# Patient Record
Sex: Female | Born: 1980 | Race: Black or African American | Hispanic: No | Marital: Single | Smoking: Never smoker
Health system: Southern US, Community
[De-identification: ages and names within clinical notes are randomized; demographics above are authoritative.]

## PROBLEM LIST (undated history)

## (undated) DIAGNOSIS — K819 Cholecystitis, unspecified: Secondary | ICD-10-CM

## (undated) DIAGNOSIS — E079 Disorder of thyroid, unspecified: Secondary | ICD-10-CM

## (undated) DIAGNOSIS — T7840XA Allergy, unspecified, initial encounter: Secondary | ICD-10-CM

## (undated) DIAGNOSIS — F32A Depression, unspecified: Secondary | ICD-10-CM

## (undated) DIAGNOSIS — E349 Endocrine disorder, unspecified: Secondary | ICD-10-CM

## (undated) DIAGNOSIS — N946 Dysmenorrhea, unspecified: Secondary | ICD-10-CM

## (undated) DIAGNOSIS — N939 Abnormal uterine and vaginal bleeding, unspecified: Secondary | ICD-10-CM

## (undated) DIAGNOSIS — D219 Benign neoplasm of connective and other soft tissue, unspecified: Secondary | ICD-10-CM

## (undated) DIAGNOSIS — F329 Major depressive disorder, single episode, unspecified: Secondary | ICD-10-CM

## (undated) DIAGNOSIS — E282 Polycystic ovarian syndrome: Secondary | ICD-10-CM

## (undated) DIAGNOSIS — G4730 Sleep apnea, unspecified: Secondary | ICD-10-CM

## (undated) DIAGNOSIS — Z889 Allergy status to unspecified drugs, medicaments and biological substances status: Secondary | ICD-10-CM

## (undated) DIAGNOSIS — E039 Hypothyroidism, unspecified: Secondary | ICD-10-CM

## (undated) DIAGNOSIS — K219 Gastro-esophageal reflux disease without esophagitis: Secondary | ICD-10-CM

## (undated) DIAGNOSIS — F419 Anxiety disorder, unspecified: Secondary | ICD-10-CM

## (undated) HISTORY — DX: Depression, unspecified: F32.A

## (undated) HISTORY — DX: Abnormal uterine and vaginal bleeding, unspecified: N93.9

## (undated) HISTORY — DX: Anxiety disorder, unspecified: F41.9

## (undated) HISTORY — PX: CHOLECYSTECTOMY: SHX55

## (undated) HISTORY — DX: Dysmenorrhea, unspecified: N94.6

## (undated) HISTORY — DX: Allergy, unspecified, initial encounter: T78.40XA

## (undated) HISTORY — DX: Hypothyroidism, unspecified: E03.9

## (undated) HISTORY — DX: Major depressive disorder, single episode, unspecified: F32.9

## (undated) HISTORY — DX: Endocrine disorder, unspecified: E34.9

## (undated) HISTORY — DX: Benign neoplasm of connective and other soft tissue, unspecified: D21.9

## (undated) HISTORY — DX: Polycystic ovarian syndrome: E28.2

---

## 2014-03-24 ENCOUNTER — Emergency Department: Payer: Self-pay | Admitting: Emergency Medicine

## 2014-03-24 LAB — BASIC METABOLIC PANEL
Anion Gap: 7 (ref 7–16)
BUN: 10 mg/dL (ref 7–18)
CHLORIDE: 110 mmol/L — AB (ref 98–107)
CO2: 24 mmol/L (ref 21–32)
CREATININE: 0.88 mg/dL (ref 0.60–1.30)
Calcium, Total: 7.9 mg/dL — ABNORMAL LOW (ref 8.5–10.1)
EGFR (African American): >60
EGFR (Non-African Amer.): >60
GLUCOSE: 71 mg/dL (ref 65–99)
Osmolality: 279 (ref 275–301)
POTASSIUM: 3.9 mmol/L (ref 3.5–5.1)
Sodium: 141 mmol/L (ref 136–145)

## 2014-03-24 LAB — CBC
HCT: 33.8 % — ABNORMAL LOW (ref 35.0–47.0)
HGB: 10.6 g/dL — ABNORMAL LOW (ref 12.0–16.0)
MCH: 23.5 pg — AB (ref 26.0–34.0)
MCHC: 31.4 g/dL — ABNORMAL LOW (ref 32.0–36.0)
MCV: 75 fL — ABNORMAL LOW (ref 80–100)
Platelet: 224 10*3/uL (ref 150–440)
RBC: 4.52 10*6/uL (ref 3.80–5.20)
RDW: 19.9 % — AB (ref 11.5–14.5)
WBC: 7 10*3/uL (ref 3.6–11.0)

## 2014-03-24 LAB — TROPONIN I: Troponin-I: <0.02 ng/mL

## 2014-03-24 LAB — D-DIMER(ARMC): D-DIMER: 850 ng/mL

## 2014-11-27 ENCOUNTER — Emergency Department (HOSPITAL_COMMUNITY)
Admission: EM | Admit: 2014-11-27 | Discharge: 2014-11-27 | Disposition: A | Discharge status: Home / Self Care | Payer: Medicaid Other | Attending: Emergency Medicine | Admitting: Emergency Medicine

## 2014-11-27 DIAGNOSIS — Z3202 Encounter for pregnancy test, result negative: Secondary | ICD-10-CM | POA: Insufficient documentation

## 2014-11-27 DIAGNOSIS — Z880 Allergy status to penicillin: Secondary | ICD-10-CM | POA: Insufficient documentation

## 2014-11-27 DIAGNOSIS — R55 Syncope and collapse: Secondary | ICD-10-CM | POA: Diagnosis not present

## 2014-11-27 LAB — CBC
HCT: 39.6 % (ref 36.0–46.0)
Hemoglobin: 12.7 g/dL (ref 12.0–15.0)
MCH: 25 pg — ABNORMAL LOW (ref 26.0–34.0)
MCHC: 32.1 g/dL (ref 30.0–36.0)
MCV: 78 fL (ref 78.0–100.0)
Platelets: 224 10*3/uL (ref 150–400)
RBC: 5.08 MIL/uL (ref 3.87–5.11)
RDW: 17.4 % — ABNORMAL HIGH (ref 11.5–15.5)
WBC: 7.1 10*3/uL (ref 4.0–10.5)

## 2014-11-27 LAB — BASIC METABOLIC PANEL
Anion gap: 4 — ABNORMAL LOW (ref 5–15)
BUN: 11 mg/dL (ref 6–20)
CHLORIDE: 106 mmol/L (ref 101–111)
CO2: 24 mmol/L (ref 22–32)
CREATININE: 0.6 mg/dL (ref 0.44–1.00)
Calcium: 8.6 mg/dL — ABNORMAL LOW (ref 8.9–10.3)
GFR calc Af Amer: >60 mL/min (ref >60)
GFR calc non Af Amer: >60 mL/min (ref >60)
Glucose, Bld: 77 mg/dL (ref 65–99)
Potassium: 3.5 mmol/L (ref 3.5–5.1)
SODIUM: 134 mmol/L — AB (ref 135–145)

## 2014-11-27 LAB — I-STAT CHEM 8, ED
BUN: 10 mg/dL (ref 6–20)
CREATININE: 0.7 mg/dL (ref 0.44–1.00)
Calcium, Ion: 1.24 mmol/L — ABNORMAL HIGH (ref 1.12–1.23)
Chloride: 101 mmol/L (ref 101–111)
GLUCOSE: 75 mg/dL (ref 65–99)
HCT: 43 % (ref 36.0–46.0)
Hemoglobin: 14.6 g/dL (ref 12.0–15.0)
Potassium: 3.5 mmol/L (ref 3.5–5.1)
Sodium: 140 mmol/L (ref 135–145)
TCO2: 22 mmol/L (ref 0–100)

## 2014-11-27 LAB — CBG MONITORING, ED
Glucose-Capillary: 64 mg/dL — ABNORMAL LOW (ref 65–99)
Glucose-Capillary: 71 mg/dL (ref 65–99)
Glucose-Capillary: 102 mg/dL — ABNORMAL HIGH (ref 65–99)

## 2014-11-27 LAB — POC URINE PREG, ED: Preg Test, Ur: NEGATIVE

## 2014-11-27 MED ORDER — SODIUM CHLORIDE 0.9 % IV BOLUS (SEPSIS)
1000.0000 mL | Freq: Once | INTRAVENOUS | Status: AC
  Administered 2014-11-27: 1000 mL via INTRAVENOUS

## 2014-11-27 NOTE — ED Notes (Signed)
Per ems pt had near syncope at a grocery store , per ems pt felt near syncope with blurry vision and headache. Pt also co vaginal bleeding since March.  Pt is alert and oriented x4, with one complaint at this time, headache.

## 2014-11-27 NOTE — ED Notes (Signed)
Sandwich and juice offered to pt

## 2014-11-27 NOTE — Discharge Instructions (Signed)
Return to the ED with any concerns including chest pain, fanting, vomiting and not able to keep down liquids, decreased level of alertness/lethargy, or any other alarming symptoms

## 2014-11-27 NOTE — ED Provider Notes (Signed)
CSN: [id number]     Arrival date & time 11/27/14  1416 History   First MD Initiated Contact with Patient 11/27/14 1504     Chief Complaint  Patient presents with  . Near Syncope     (Consider location/radiation/quality/duration/timing/severity/associated sxs/prior Treatment) HPI  Pt presenting with c/o near syncope. Pt was at a grocery store- she felt light headed with tunnel vision.  No chest pain or palpitations.  She has mild headache, frontal associated.  No difficulty breathing.  No syncope.  Felt nauseated this morning, no vomiting.  No diarrhea.  There are no other associated systemic symptoms, there are no other alleviating or modifying factors.  No focal weakness or numbness, no changes in vision or speech.  Pt does note that she has been having vaginal spotting over the past 27months- this is after having implanon started- she talked with doctors in [place] who told her this could be normal side effect of the implanon.  She has no abdominal pain.  There are no other associated systemic symptoms, there are no other alleviating or modifying factors.   No past medical history on file. No past surgical history on file. No family history on file. History  Substance Use Topics  . Smoking status: Not on file  . Smokeless tobacco: Not on file  . Alcohol Use: Not on file   OB History    No data available     Review of Systems  ROS reviewed and all otherwise negative except for mentioned in HPI    Allergies  Aspirin and Penicillins  Home Medications   Prior to Admission medications   Medication Sig Start Date End Date Taking? Authorizing Provider  etonogestrel (NEXPLANON) 68 MG IMPL implant 1 each by Subdermal route once.   Yes Historical Provider, MD   BP 95/66 mmHg  Pulse 82  Temp(Src) 98.3 F (36.8 C) (Oral)  Resp 16  SpO2 98%  Vitals reviewed Physical Exam  Physical Examination: General appearance - alert, well appearing, and in no distress Mental status - alert,  oriented to person, place, and time Eyes - no conjunctival injection no scleral icterus Mouth - mucous membranes moist, pharynx normal without lesions Chest - clear to auscultation, no wheezes, rales or rhonchi, symmetric air entry Heart - normal rate, regular rhythm, normal S1, S2, no murmurs, rubs, clicks or gallops Abdomen - soft, nontender, nondistended, no masses or organomegaly Extremities - peripheral pulses normal, no pedal edema, no clubbing or cyanosis Skin - normal coloration and turgor, no rashes  ED Course  Procedures (including critical care time) Labs Review Labs Reviewed  CBC - Abnormal; Notable for the following:    MCH 25.0 (*)    RDW 17.4 (*)    All other components within normal limits  BASIC METABOLIC PANEL - Abnormal; Notable for the following:    Sodium 134 (*)    Calcium 8.6 (*)    Anion gap 4 (*)    All other components within normal limits  I-STAT CHEM 8, ED - Abnormal; Notable for the following:    Calcium, Ion 1.24 (*)    All other components within normal limits  CBG MONITORING, ED - Abnormal; Notable for the following:    Glucose-Capillary 64 (*)    All other components within normal limits  CBG MONITORING, ED - Abnormal; Notable for the following:    Glucose-Capillary 102 (*)    All other components within normal limits  POC URINE PREG, ED  CBG MONITORING, ED  Imaging Review No results found.   EKG Interpretation   Date/Time:  Thursday Nov 27 2014 14:29:18 EDT Ventricular Rate:  79 PR Interval:  156 QRS Duration: 83 QT Interval:  361 QTC Calculation: 414 R Axis:   73 Text Interpretation:  Sinus rhythm Baseline wander in lead(s) V1 No  significant change since last tracing Confirmed by Canary Brim  MD, MARTHA  [phone]) on 11/27/2014 5:32:36 PM      MDM   Final diagnoses:  Near syncope    Pt presenting after near syncope episode today.  Labs and EKG and vital signs are reassuring.  Pt does not meet [place] criteria for high risk  syncope.  She does note she has been having vaginal spottin over the past 2 months after implanon- she has followed up with doctors in [place] who implanted it and was told it is a normal side effect.  She is not anemic, not orthostatic.  No abdominal pain, negative pregnancy test.  Pt given followup information with gynecology.  Discharged with strict return precautions.  Pt agreeable with plan.    Alfonzo Beers, MD 11/27/14 2130

## 2014-11-27 NOTE — ED Notes (Signed)
Bed: SR15 Expected date:  Expected time:  Means of arrival:  Comments: EMS- 34yo F, near syncope, vaginal bleeding since March

## 2015-02-05 ENCOUNTER — Ambulatory Visit: Payer: Self-pay | Admitting: Family Medicine

## 2015-08-12 ENCOUNTER — Ambulatory Visit (HOSPITAL_BASED_OUTPATIENT_CLINIC_OR_DEPARTMENT_OTHER): Payer: Medicaid Other | Attending: Otolaryngology | Admitting: Radiology

## 2015-08-12 VITALS — Ht 66.0 in | Wt 270.0 lb

## 2015-08-12 DIAGNOSIS — R5383 Other fatigue: Secondary | ICD-10-CM | POA: Insufficient documentation

## 2015-08-12 DIAGNOSIS — G4733 Obstructive sleep apnea (adult) (pediatric): Secondary | ICD-10-CM | POA: Insufficient documentation

## 2015-08-12 DIAGNOSIS — Z6841 Body Mass Index (BMI) 40.0 and over, adult: Secondary | ICD-10-CM | POA: Diagnosis not present

## 2015-08-12 DIAGNOSIS — R0683 Snoring: Secondary | ICD-10-CM

## 2015-08-12 DIAGNOSIS — E669 Obesity, unspecified: Secondary | ICD-10-CM | POA: Insufficient documentation

## 2015-08-16 DIAGNOSIS — R0683 Snoring: Secondary | ICD-10-CM | POA: Diagnosis not present

## 2015-08-16 DIAGNOSIS — G4733 Obstructive sleep apnea (adult) (pediatric): Secondary | ICD-10-CM | POA: Diagnosis not present

## 2015-08-16 NOTE — Progress Notes (Signed)
Patient Name: Emily Houston, Emily Houston Date: 08/12/2015 Gender: Female D.O.B: February 06, 1981 Age (years): 34 Referring Provider: Jodi Marble Height (inches): 69 Interpreting Physician: Baird Lyons MD, ABSM Weight (lbs): 270 RPSGT: Laren Everts BMI: 44 MRN: [id number] Neck Size: 16.00 CLINICAL INFORMATION Sleep Study Type: Split Night CPAP Indication for sleep study: Fatigue, Obesity, Snoring Epworth Sleepiness Score: 9  SLEEP STUDY TECHNIQUE As per the AASM Manual for the Scoring of Sleep and Associated Events v2.3 (April 2016) with a hypopnea requiring 4% desaturations. The channels recorded and monitored were frontal, central and occipital EEG, electrooculogram (EOG), submentalis EMG (chin), nasal and oral airflow, thoracic and abdominal wall motion, anterior tibialis EMG, snore microphone, electrocardiogram, and pulse oximetry. Continuous positive airway pressure (CPAP) was initiated when the patient met split night criteria and was titrated according to treat sleep-disordered breathing.  MEDICATIONS Medications taken by the patient : charted for review Medications administered by patient during sleep study : No sleep medicine administered.  RESPIRATORY PARAMETERS Diagnostic Total AHI (/hr): 36.2 RDI (/hr): 37.5 OA Index (/hr): 19.9 CA Index (/hr): 0.0 REM AHI (/hr): 110.8 NREM AHI (/hr): 32.4 Supine AHI (/hr): 113.1 Non-supine AHI (/hr): 0.00 Min O2 Sat (%): 79.00 Mean O2 (%): 92.56 Time below 88% (min): 6.1   Titration Optimal Pressure (cm): 12 AHI at Optimal Pressure (/hr): 0.0 Min O2 at Optimal Pressure (%): 94.0 Supine % at Optimal (%): 54 Sleep % at Optimal (%): 97    SLEEP ARCHITECTURE The recording time for the entire night was 397.4 minutes. During a baseline period of 198.1 minutes, the patient slept for 136.0 minutes in REM and nonREM, yielding a sleep efficiency of 68.7%. Sleep onset after lights out was 54.7 minutes with a REM latency of 93.0 minutes. The  patient spent 12.13% of the night in stage N1 sleep, 49.26% in stage N2 sleep, 33.82% in stage N3 and 4.78% in REM. During the titration period of 191.3 minutes, the patient slept for 182.0 minutes in REM and nonREM, yielding a sleep efficiency of 95.1%. Sleep onset after CPAP initiation was 4.2 minutes with a REM latency of 31.5 minutes. The patient spent 8.52% of the night in stage N1 sleep, 56.59% in stage N2 sleep, 0.82% in stage N3 and 34.07% in REM.  CARDIAC DATA The 2 lead EKG demonstrated sinus rhythm. The mean heart rate was 85.19 beats per minute. Other EKG findings include: None.  LEG MOVEMENT DATA The total Periodic Limb Movements of Sleep (PLMS) were 0. The PLMS index was 0.00 .  IMPRESSIONS - Severe obstructive sleep apnea occurred during the diagnostic portion of the study (AHI = 36.2/hour). An optimal PAP pressure was selected for this patient ( 12 cm of water) - No significant central sleep apnea occurred during the diagnostic portion of the study (CAI = 0.0/hour). - Moderate oxygen desaturation was noted during the diagnostic portion of the study (Min O2 =79.00%). - The patient snored with Moderate snoring volume during the diagnostic portion of the study. - No cardiac abnormalities were noted during this study. - Clinically significant periodic limb movements did not occur during sleep.  DIAGNOSIS - Obstructive Sleep Apnea (327.23 [G47.33 ICD-10])  RECOMMENDATIONS - Trial of CPAP therapy on 12 cm H2O with a Medium size Resmed Full Face Mask AirFit F20 mask and heated humidification. - Avoid alcohol, sedatives and other CNS depressants that may worsen sleep apnea and disrupt normal sleep architecture. - Sleep hygiene should be reviewed to assess factors that may improve sleep quality. - Weight management and  regular exercise should be initiated or continued.  Deneise Lever Diplomate, American Board of Sleep Medicine  ELECTRONICALLY SIGNED ON:  08/16/2015, 10:43  AM [place] PH: [phone]   FX: [phone] [place]

## 2016-03-01 ENCOUNTER — Emergency Department (HOSPITAL_COMMUNITY): Payer: Medicaid Other

## 2016-03-01 ENCOUNTER — Encounter (HOSPITAL_COMMUNITY): Payer: Self-pay

## 2016-03-01 ENCOUNTER — Emergency Department (HOSPITAL_COMMUNITY)
Admission: EM | Admit: 2016-03-01 | Discharge: 2016-03-01 | Disposition: A | Discharge status: Home / Self Care | Payer: Medicaid Other | Attending: Emergency Medicine | Admitting: Emergency Medicine

## 2016-03-01 DIAGNOSIS — K8020 Calculus of gallbladder without cholecystitis without obstruction: Secondary | ICD-10-CM | POA: Diagnosis not present

## 2016-03-01 DIAGNOSIS — R112 Nausea with vomiting, unspecified: Secondary | ICD-10-CM | POA: Diagnosis present

## 2016-03-01 LAB — COMPREHENSIVE METABOLIC PANEL
ALT: 13 U/L — AB (ref 14–54)
AST: 18 U/L (ref 15–41)
Albumin: 3.3 g/dL — ABNORMAL LOW (ref 3.5–5.0)
Alkaline Phosphatase: 51 U/L (ref 38–126)
Anion gap: 4 — ABNORMAL LOW (ref 5–15)
BUN: 9 mg/dL (ref 6–20)
CHLORIDE: 108 mmol/L (ref 101–111)
CO2: 26 mmol/L (ref 22–32)
CREATININE: 0.77 mg/dL (ref 0.44–1.00)
Calcium: 8.4 mg/dL — ABNORMAL LOW (ref 8.9–10.3)
GFR calc Af Amer: >60 mL/min (ref >60)
Glucose, Bld: 85 mg/dL (ref 65–99)
POTASSIUM: 3.9 mmol/L (ref 3.5–5.1)
Sodium: 138 mmol/L (ref 135–145)
Total Bilirubin: 0.4 mg/dL (ref 0.3–1.2)
Total Protein: 6.5 g/dL (ref 6.5–8.1)

## 2016-03-01 LAB — CBC
HCT: 37.4 % (ref 36.0–46.0)
Hemoglobin: 12.4 g/dL (ref 12.0–15.0)
MCH: 26.8 pg (ref 26.0–34.0)
MCHC: 33.2 g/dL (ref 30.0–36.0)
MCV: 81 fL (ref 78.0–100.0)
PLATELETS: 208 10*3/uL (ref 150–400)
RBC: 4.62 MIL/uL (ref 3.87–5.11)
RDW: 15 % (ref 11.5–15.5)
WBC: 6.6 10*3/uL (ref 4.0–10.5)

## 2016-03-01 LAB — POC URINE PREG, ED: Preg Test, Ur: NEGATIVE

## 2016-03-01 LAB — URINALYSIS, ROUTINE W REFLEX MICROSCOPIC
Bilirubin Urine: NEGATIVE
Glucose, UA: NEGATIVE mg/dL
HGB URINE DIPSTICK: NEGATIVE
Ketones, ur: NEGATIVE mg/dL
LEUKOCYTES UA: NEGATIVE
Nitrite: NEGATIVE
PH: 7.5 (ref 5.0–8.0)
PROTEIN: NEGATIVE mg/dL
SPECIFIC GRAVITY, URINE: 1.024 (ref 1.005–1.030)

## 2016-03-01 LAB — LIPASE, BLOOD: LIPASE: 31 U/L (ref 11–51)

## 2016-03-01 MED ORDER — PANTOPRAZOLE SODIUM 20 MG PO TBEC: 20.0000 mg | DELAYED_RELEASE_TABLET | Freq: Every day | ORAL | 0 refills | Status: DC

## 2016-03-01 MED ORDER — ONDANSETRON HCL 4 MG PO TABS: 4.0000 mg | ORAL_TABLET | Freq: Four times a day (QID) | ORAL | 0 refills | Status: DC

## 2016-03-01 NOTE — ED Triage Notes (Signed)
Pt presents with c/o nausea and vomiting for a couple of weeks, no diarrhea at the present time. Pt denies being around anyone else who has been sick. NAD, ambulatory to room.

## 2016-03-01 NOTE — ED Notes (Signed)
Nurse stated he will collect the labs

## 2016-03-01 NOTE — ED Provider Notes (Signed)
[place] DEPT Provider Note   CSN: [id number] Arrival date & time: 03/01/16  1544     History   Chief Complaint Chief Complaint  Patient presents with  . Nausea  . Emesis    HPI Emily Houston is a 36 y.o. female.  HPI Emily Houston is a 35 y.o. female with no medical problems, presents to emergency department complaining of nausea and vomiting for several weeks. This is a recurrent problem for the patient. Patient states this time the episode has lasted the longest. She reports intermittent abdominal cramping. Denies any fever or chills. No diarrhea. No urinary symptoms. No vaginal discharge or bleeding. Does not think she is pregnant. Her last menstrual cycle was one month ago. She has not tried any medications to help with her nausea symptoms. She states that she has tried to adjust her diet, states that no matter what she eats she develops nausea and at times vomiting. Pt states she is scared to eat.   History reviewed. No pertinent past medical history.  There are no active problems to display for this patient.   Past Surgical History:  Procedure Laterality Date  . CESAREAN SECTION      OB History    No data available       Home Medications    Prior to Admission medications   Medication Sig Start Date End Date Taking? Authorizing Provider  fluticasone (FLONASE) 50 MCG/ACT nasal spray U 1 SPRAY IEN ONCE A DAY AS NEEDED FOR ALLERGIES 01/27/16  Yes Historical Provider, MD  MedroxyPROGESTERone Acetate 150 MG/ML SUSY INJECT 1 ML INTO THE MUSCLE Q 90 DAYS 01/25/16  Yes Historical Provider, MD  phentermine (ADIPEX-P) 37.5 MG tablet Take 37.5 mg by mouth daily before breakfast.   Yes Historical Provider, MD  baclofen (LIORESAL) 10 MG tablet TK 1 T PO ONE TO THREE TIMES DAILY PRN FOR PAIN 01/25/16   Historical Provider, MD    Family History No family history on file.  Social History Social History  Substance Use Topics  . Smoking status: Never Smoker    . Smokeless tobacco: Never Used  . Alcohol use No     Allergies   Aspirin and Penicillins   Review of Systems Review of Systems  Constitutional: Negative for chills and fever.  Respiratory: Negative for cough, chest tightness and shortness of breath.   Cardiovascular: Negative for chest pain, palpitations and leg swelling.  Gastrointestinal: Positive for abdominal pain, nausea and vomiting. Negative for diarrhea.  Genitourinary: Negative for dysuria, flank pain, pelvic pain, vaginal bleeding, vaginal discharge and vaginal pain.  Musculoskeletal: Negative for arthralgias, myalgias, neck pain and neck stiffness.  Skin: Negative for rash.  Neurological: Negative for dizziness, weakness and headaches.  All other systems reviewed and are negative.    Physical Exam Updated Vital Signs BP 116/70 (BP Location: Left Arm)   Pulse 70   Temp 98.2 F (36.8 C) (Oral)   Resp 18   Ht 5\' 6"  (1.676 m)   Wt 113.4 kg   LMP 01/31/2016 (Approximate)   SpO2 100%   BMI 40.35 kg/m   Physical Exam  Constitutional: She appears well-developed and well-nourished. No distress.  HENT:  Head: Normocephalic.  Eyes: Conjunctivae are normal.  Neck: Neck supple.  Cardiovascular: Normal rate, regular rhythm and normal heart sounds.   Pulmonary/Chest: Effort normal and breath sounds normal. No respiratory distress. She has no wheezes. She has no rales.  Abdominal: Soft. Bowel sounds are normal. She exhibits no distension. There  is no tenderness. There is no rebound and no guarding.  Musculoskeletal: She exhibits no edema.  Neurological: She is alert.  Skin: Skin is warm and dry.  Psychiatric: She has a normal mood and affect. Her behavior is normal.  Nursing note and vitals reviewed.    ED Treatments / Results  Labs (all labs ordered are listed, but only abnormal results are displayed) Labs Reviewed  COMPREHENSIVE METABOLIC PANEL - Abnormal; Notable for the following:       Result Value    Calcium 8.4 (*)    Albumin 3.3 (*)    ALT 13 (*)    Anion gap 4 (*)    All other components within normal limits  LIPASE, BLOOD  CBC  URINALYSIS, ROUTINE W REFLEX MICROSCOPIC (NOT AT [facility])  POC URINE PREG, ED    EKG  EKG Interpretation None       Radiology US Abdomen Complete  Result Date: 03/01/2016 CLINICAL DATA:  Postprandial nausea and vomiting.  Abdominal pain. EXAM: ABDOMEN ULTRASOUND COMPLETE COMPARISON:  None. FINDINGS: Gallbladder: Chronic gallstones are present. The largest measures 1.0 cm. There is shadowing. Maximal wall thickening is 2.4 mm, within normal limits. The gallbladder seems somewhat contracted. There is no sonographic Murphy sign reported. Common bile duct: Diameter: 2.8 mm, within normal limits. Liver: No focal lesion identified. Within normal limits in parenchymal echogenicity. IVC: No abnormality visualized. Pancreas: Visualized portion unremarkable. Spleen: Size and appearance within normal limits. Right Kidney: Length: 11.4 cm, within normal limits. Echogenicity within normal limits. No mass or hydronephrosis visualized. Left Kidney: Length: 11.7 cm, within normal limits. Echogenicity within normal limits. No mass or hydronephrosis visualized. Abdominal aorta: No aneurysm visualized. Other findings: None. IMPRESSION: 1. Cholelithiasis without evidence for cholecystitis. 2. No acute or focal abnormality to explain the patient's symptoms otherwise. Electronically Signed   By: San Morelle M.D.   On: 03/01/2016 17:34    Procedures Procedures (including critical care time)  Medications Ordered in ED Medications - No data to display   Initial Impression / Assessment and Plan / ED Course  I have reviewed the triage vital signs and the nursing notes.  Pertinent labs & imaging results that were available during my care of the patient were reviewed by me and considered in my medical decision making (see chart for details).  Clinical Course  Comment By  Time  Pt seen and examined. Pt with persistent nausea and vomiting, intermittent abdominal pain. It is in NAD. Will check labs, preg test, Fleeta Emmer, PA-C 08/15 1656   Patient emergency department with nausea and vomiting, intermittent abdominal pain, worse after eating. Lab work including LFTs, lipase, CBC obtained, all negative. Calcium slightly low at 8.4. Albumin slightly low at 3.3. Urine pregnancy is negative. Urinalysis is unremarkable. Ultrasound abdomen obtained which shows cholelithiasis, otherwise normal.  Patient is currently symptom free. She is tolerating oral liquids and foods. Abdomen is currently nontender. I do not think she needs any further emergency room workup. Will have a follow-up with general surgery. On Protonix and Zofran.   Final Clinical Impressions(s) / ED Diagnoses   Final diagnoses:  Calculus of gallbladder without cholecystitis without obstruction    New Prescriptions New Prescriptions   No medications on file     Jeannett Senior, PA-C 03/01/16 [place], MD 03/01/16 [phone]

## 2016-03-01 NOTE — ED Notes (Signed)
Bed: WTR6 Expected date:  Expected time:  Means of arrival:  Comments: 

## 2016-03-01 NOTE — Discharge Instructions (Signed)
Take protonix for abdominal pain. Take zofran for nausea. Avoid any fatty of spicy foods. Follow up with general surgery for further evaluation of gall bladder. Return if worsening.

## 2016-03-01 NOTE — ED Notes (Signed)
US at bedside

## 2016-03-11 ENCOUNTER — Ambulatory Visit: Payer: Self-pay | Admitting: Surgery

## 2016-03-14 ENCOUNTER — Encounter (HOSPITAL_COMMUNITY): Payer: Self-pay | Admitting: *Deleted

## 2016-03-14 MED ORDER — VANCOMYCIN HCL 10 G IV SOLR
1500.0000 mg | INTRAVENOUS | Status: AC
  Administered 2016-03-15: 1500 mg via INTRAVENOUS
  Filled 2016-03-14 (×2): qty 1500

## 2016-03-14 MED ORDER — HEPARIN SODIUM (PORCINE) 5000 UNIT/ML IJ SOLN
5000.0000 [IU] | Freq: Once | INTRAMUSCULAR | Status: AC
  Administered 2016-03-15: 5000 [IU] via SUBCUTANEOUS
  Filled 2016-03-14: qty 1

## 2016-03-14 NOTE — Progress Notes (Signed)
Pt denies SOB, chest pain, and being under the care of a cardiologist. Pt denies having a stress test, echo and cardiac cath. Pt denies having an EKG and chest x ray within the last year. Pt had labs on 03/01/16 ( in [place]). Pt stated that her last dose of Phentermine was " a Couple of weeks ago."  Pt made aware to stop taking Aspirin, vitamins, Phentermine, Fish oil and herbal medications. Do not take any NSAIDs ie: Ibuprofen, Advil, Naproxen, BC and Goody Powder or any medication containing Aspirin. Pt verbalized understanding of all pre-op instructions.

## 2016-03-15 ENCOUNTER — Ambulatory Visit (HOSPITAL_COMMUNITY): Payer: Medicaid Other | Admitting: Certified Registered"

## 2016-03-15 ENCOUNTER — Encounter (HOSPITAL_COMMUNITY)
Admission: RE | Disposition: A | Discharge status: Home / Self Care | Payer: Self-pay | Source: Ambulatory Visit | Attending: Surgery

## 2016-03-15 ENCOUNTER — Encounter (HOSPITAL_COMMUNITY): Payer: Self-pay | Admitting: *Deleted

## 2016-03-15 ENCOUNTER — Ambulatory Visit (HOSPITAL_COMMUNITY)
Admission: RE | Admit: 2016-03-15 | Discharge: 2016-03-15 | Disposition: A | Discharge status: Home / Self Care | Payer: Medicaid Other | Source: Ambulatory Visit | Attending: Surgery | Admitting: Surgery

## 2016-03-15 DIAGNOSIS — K8010 Calculus of gallbladder with chronic cholecystitis without obstruction: Secondary | ICD-10-CM | POA: Diagnosis not present

## 2016-03-15 DIAGNOSIS — Z6841 Body Mass Index (BMI) 40.0 and over, adult: Secondary | ICD-10-CM | POA: Diagnosis not present

## 2016-03-15 DIAGNOSIS — E669 Obesity, unspecified: Secondary | ICD-10-CM | POA: Insufficient documentation

## 2016-03-15 DIAGNOSIS — Z79899 Other long term (current) drug therapy: Secondary | ICD-10-CM | POA: Insufficient documentation

## 2016-03-15 DIAGNOSIS — K219 Gastro-esophageal reflux disease without esophagitis: Secondary | ICD-10-CM | POA: Diagnosis not present

## 2016-03-15 DIAGNOSIS — K811 Chronic cholecystitis: Secondary | ICD-10-CM | POA: Diagnosis present

## 2016-03-15 DIAGNOSIS — Z793 Long term (current) use of hormonal contraceptives: Secondary | ICD-10-CM | POA: Insufficient documentation

## 2016-03-15 HISTORY — DX: Gastro-esophageal reflux disease without esophagitis: K21.9

## 2016-03-15 HISTORY — PX: CHOLECYSTECTOMY: SHX55

## 2016-03-15 HISTORY — DX: Cholecystitis, unspecified: K81.9

## 2016-03-15 HISTORY — DX: Morbid (severe) obesity due to excess calories: E66.01

## 2016-03-15 HISTORY — DX: Allergy status to unspecified drugs, medicaments and biological substances: Z88.9

## 2016-03-15 HISTORY — DX: Sleep apnea, unspecified: G47.30

## 2016-03-15 SURGERY — LAPAROSCOPIC CHOLECYSTECTOMY WITH INTRAOPERATIVE CHOLANGIOGRAM
Anesthesia: General

## 2016-03-15 SURGERY — LAPAROSCOPIC CHOLECYSTECTOMY
Anesthesia: General | Site: Abdomen

## 2016-03-15 MED ORDER — ROCURONIUM BROMIDE 10 MG/ML (PF) SYRINGE
PREFILLED_SYRINGE | INTRAVENOUS | Status: AC
  Filled 2016-03-15: qty 10

## 2016-03-15 MED ORDER — FENTANYL CITRATE (PF) 100 MCG/2ML IJ SOLN
INTRAMUSCULAR | Status: AC
  Filled 2016-03-15: qty 2

## 2016-03-15 MED ORDER — HYDROCODONE-ACETAMINOPHEN 5-325 MG PO TABS
ORAL_TABLET | ORAL | Status: AC
  Filled 2016-03-15: qty 1

## 2016-03-15 MED ORDER — DOCUSATE SODIUM 100 MG PO CAPS: 100.0000 mg | ORAL_CAPSULE | Freq: Two times a day (BID) | ORAL | 0 refills | Status: AC

## 2016-03-15 MED ORDER — LIDOCAINE HCL (CARDIAC) 20 MG/ML IV SOLN
INTRAVENOUS | Status: DC | PRN
  Administered 2016-03-15: 90 mg via INTRAVENOUS

## 2016-03-15 MED ORDER — SUGAMMADEX SODIUM 500 MG/5ML IV SOLN
INTRAVENOUS | Status: DC | PRN
  Administered 2016-03-15: 300 mg via INTRAVENOUS

## 2016-03-15 MED ORDER — ROCURONIUM BROMIDE 100 MG/10ML IV SOLN
INTRAVENOUS | Status: DC | PRN
  Administered 2016-03-15: 50 mg via INTRAVENOUS
  Administered 2016-03-15: 10 mg via INTRAVENOUS

## 2016-03-15 MED ORDER — MIDAZOLAM HCL 5 MG/5ML IJ SOLN
INTRAMUSCULAR | Status: DC | PRN
  Administered 2016-03-15: 2 mg via INTRAVENOUS

## 2016-03-15 MED ORDER — LIDOCAINE 2% (20 MG/ML) 5 ML SYRINGE
INTRAMUSCULAR | Status: AC
  Filled 2016-03-15: qty 5

## 2016-03-15 MED ORDER — PROPOFOL 10 MG/ML IV BOLUS
INTRAVENOUS | Status: AC
  Filled 2016-03-15: qty 20

## 2016-03-15 MED ORDER — ONDANSETRON HCL 4 MG/2ML IJ SOLN
INTRAMUSCULAR | Status: DC | PRN
  Administered 2016-03-15: 4 mg via INTRAVENOUS

## 2016-03-15 MED ORDER — PROPOFOL 10 MG/ML IV BOLUS
INTRAVENOUS | Status: DC | PRN
  Administered 2016-03-15: 170 mg via INTRAVENOUS

## 2016-03-15 MED ORDER — MEPERIDINE HCL 25 MG/ML IJ SOLN: 6.2500 mg | INTRAMUSCULAR | Status: DC | PRN

## 2016-03-15 MED ORDER — LACTATED RINGERS IV SOLN
INTRAVENOUS | Status: DC
  Administered 2016-03-15: 11:00:00 via INTRAVENOUS

## 2016-03-15 MED ORDER — EPHEDRINE 5 MG/ML INJ
INTRAVENOUS | Status: AC
  Filled 2016-03-15: qty 10

## 2016-03-15 MED ORDER — PHENYLEPHRINE 40 MCG/ML (10ML) SYRINGE FOR IV PUSH (FOR BLOOD PRESSURE SUPPORT)
PREFILLED_SYRINGE | INTRAVENOUS | Status: AC
  Filled 2016-03-15: qty 10

## 2016-03-15 MED ORDER — HYDROCODONE-ACETAMINOPHEN 5-325 MG PO TABS
1.0000 | ORAL_TABLET | Freq: Once | ORAL | Status: AC
  Administered 2016-03-15: 1 via ORAL

## 2016-03-15 MED ORDER — BUPIVACAINE-EPINEPHRINE 0.25% -1:200000 IJ SOLN
INTRAMUSCULAR | Status: DC | PRN
  Administered 2016-03-15: 30 mL

## 2016-03-15 MED ORDER — PHENYLEPHRINE HCL 10 MG/ML IJ SOLN
INTRAMUSCULAR | Status: DC | PRN
  Administered 2016-03-15 (×2): 120 ug via INTRAVENOUS

## 2016-03-15 MED ORDER — HYDROCODONE-ACETAMINOPHEN 5-325 MG PO TABS: 1.0000 | ORAL_TABLET | Freq: Four times a day (QID) | ORAL | 0 refills | Status: DC | PRN

## 2016-03-15 MED ORDER — SODIUM CHLORIDE 0.9 % IR SOLN
Status: DC | PRN
  Administered 2016-03-15: 1000 mL

## 2016-03-15 MED ORDER — SODIUM CHLORIDE 0.9 % IV SOLN: 250.0000 mL | INTRAVENOUS | Status: DC | PRN

## 2016-03-15 MED ORDER — ONDANSETRON HCL 4 MG/2ML IJ SOLN: 4.0000 mg | Freq: Once | INTRAMUSCULAR | Status: DC | PRN

## 2016-03-15 MED ORDER — HYDROMORPHONE HCL 1 MG/ML IJ SOLN
0.2500 mg | INTRAMUSCULAR | Status: DC | PRN
Start: 2016-03-15 — End: 2016-03-15
  Administered 2016-03-15 (×2): 0.5 mg via INTRAVENOUS

## 2016-03-15 MED ORDER — CHLORHEXIDINE GLUCONATE CLOTH 2 % EX PADS: 6.0000 | MEDICATED_PAD | Freq: Once | CUTANEOUS | Status: DC

## 2016-03-15 MED ORDER — HYDROMORPHONE HCL 1 MG/ML IJ SOLN
INTRAMUSCULAR | Status: AC
  Filled 2016-03-15: qty 1

## 2016-03-15 MED ORDER — EPHEDRINE SULFATE 50 MG/ML IJ SOLN
INTRAMUSCULAR | Status: DC | PRN
  Administered 2016-03-15: 10 mg via INTRAVENOUS

## 2016-03-15 MED ORDER — 0.9 % SODIUM CHLORIDE (POUR BTL) OPTIME
TOPICAL | Status: DC | PRN
  Administered 2016-03-15: 1000 mL

## 2016-03-15 MED ORDER — FENTANYL CITRATE (PF) 100 MCG/2ML IJ SOLN
INTRAMUSCULAR | Status: DC | PRN
  Administered 2016-03-15 (×2): 50 ug via INTRAVENOUS
  Administered 2016-03-15: 100 ug via INTRAVENOUS

## 2016-03-15 MED ORDER — MIDAZOLAM HCL 2 MG/2ML IJ SOLN
INTRAMUSCULAR | Status: AC
  Filled 2016-03-15: qty 2

## 2016-03-15 MED ORDER — BUPIVACAINE-EPINEPHRINE (PF) 0.25% -1:200000 IJ SOLN
INTRAMUSCULAR | Status: AC
  Filled 2016-03-15: qty 30

## 2016-03-15 MED ORDER — SODIUM CHLORIDE 0.9% FLUSH: 3.0000 mL | Freq: Two times a day (BID) | INTRAVENOUS | Status: DC

## 2016-03-15 MED ORDER — SODIUM CHLORIDE 0.9% FLUSH: 3.0000 mL | INTRAVENOUS | Status: DC | PRN

## 2016-03-15 SURGICAL SUPPLY — 45 items
APPLIER CLIP 5 13 M/L LIGAMAX5 (MISCELLANEOUS) ×3
APPLIER CLIP ROT 10 11.4 M/L (STAPLE)
BENZOIN TINCTURE PRP APPL 2/3 (GAUZE/BANDAGES/DRESSINGS) ×3 IMPLANT
BLADE SURG ROTATE 9660 (MISCELLANEOUS) IMPLANT
CANISTER SUCTION 2500CC (MISCELLANEOUS) ×3 IMPLANT
CHLORAPREP W/TINT 26ML (MISCELLANEOUS) ×3 IMPLANT
CLIP APPLIE 5 13 M/L LIGAMAX5 (MISCELLANEOUS) ×1 IMPLANT
CLIP APPLIE ROT 10 11.4 M/L (STAPLE) IMPLANT
CLOSURE WOUND 1/2 X4 (GAUZE/BANDAGES/DRESSINGS)
COVER SURGICAL LIGHT HANDLE (MISCELLANEOUS) ×3 IMPLANT
DRSG TEGADERM 2-3/8X2-3/4 SM (GAUZE/BANDAGES/DRESSINGS) IMPLANT
DRSG TEGADERM 4X4.75 (GAUZE/BANDAGES/DRESSINGS) IMPLANT
ELECT REM PT RETURN 9FT ADLT (ELECTROSURGICAL) ×3
ELECTRODE REM PT RTRN 9FT ADLT (ELECTROSURGICAL) ×1 IMPLANT
GAUZE SPONGE 2X2 8PLY STRL LF (GAUZE/BANDAGES/DRESSINGS) IMPLANT
GLOVE BIO SURGEON STRL SZ 6 (GLOVE) ×3 IMPLANT
GLOVE BIOGEL PI IND STRL 6.5 (GLOVE) ×2 IMPLANT
GLOVE BIOGEL PI INDICATOR 6.5 (GLOVE) ×4
GLOVE BIOGEL PI ORTHO PRO SZ7 (GLOVE) ×2
GLOVE PI ORTHO PRO STRL SZ7 (GLOVE) ×1 IMPLANT
GLOVE SURG SS PI 6.5 STRL IVOR (GLOVE) ×3 IMPLANT
GOWN STRL REUS W/ TWL LRG LVL3 (GOWN DISPOSABLE) ×3 IMPLANT
GOWN STRL REUS W/ TWL XL LVL3 (GOWN DISPOSABLE) ×1 IMPLANT
GOWN STRL REUS W/TWL LRG LVL3 (GOWN DISPOSABLE) ×6
GOWN STRL REUS W/TWL XL LVL3 (GOWN DISPOSABLE) ×2
KIT BASIN OR (CUSTOM PROCEDURE TRAY) ×3 IMPLANT
KIT ROOM TURNOVER OR (KITS) ×3 IMPLANT
LIQUID BAND (GAUZE/BANDAGES/DRESSINGS) ×3 IMPLANT
NS IRRIG 1000ML POUR BTL (IV SOLUTION) ×3 IMPLANT
PAD ARMBOARD 7.5X6 YLW CONV (MISCELLANEOUS) ×3 IMPLANT
POUCH SPECIMEN RETRIEVAL 10MM (ENDOMECHANICALS) ×3 IMPLANT
SCISSORS LAP 5X35 DISP (ENDOMECHANICALS) ×3 IMPLANT
SET IRRIG TUBING LAPAROSCOPIC (IRRIGATION / IRRIGATOR) ×3 IMPLANT
SLEEVE ENDOPATH XCEL 5M (ENDOMECHANICALS) ×3 IMPLANT
SPECIMEN JAR SMALL (MISCELLANEOUS) ×3 IMPLANT
SPONGE GAUZE 2X2 STER 10/PKG (GAUZE/BANDAGES/DRESSINGS)
STRIP CLOSURE SKIN 1/2X4 (GAUZE/BANDAGES/DRESSINGS) IMPLANT
SUT MNCRL AB 4-0 PS2 18 (SUTURE) ×3 IMPLANT
TOWEL OR 17X24 6PK STRL BLUE (TOWEL DISPOSABLE) ×3 IMPLANT
TOWEL OR 17X26 10 PK STRL BLUE (TOWEL DISPOSABLE) IMPLANT
TRAY LAPAROSCOPIC MC (CUSTOM PROCEDURE TRAY) ×3 IMPLANT
TROCAR XCEL BLUNT TIP 100MML (ENDOMECHANICALS) ×3 IMPLANT
TROCAR XCEL NON-BLD 11X100MML (ENDOMECHANICALS) ×3 IMPLANT
TROCAR XCEL NON-BLD 5MMX100MML (ENDOMECHANICALS) ×3 IMPLANT
TUBING INSUFFLATION (TUBING) ×3 IMPLANT

## 2016-03-15 NOTE — Anesthesia Postprocedure Evaluation (Signed)
Anesthesia Post Note  Patient: Emily Houston  Procedure(s) Performed: Procedure(s) (LRB): LAPAROSCOPIC CHOLECYSTECTOMY (N/A)  Patient location during evaluation: PACU Anesthesia Type: General Level of consciousness: awake and alert Pain management: pain level controlled Vital Signs Assessment: post-procedure vital signs reviewed and stable Respiratory status: spontaneous breathing, nonlabored ventilation, respiratory function stable and patient connected to nasal cannula oxygen Cardiovascular status: blood pressure returned to baseline and stable Postop Assessment: no signs of nausea or vomiting Anesthetic complications: no    Last Vitals:  Vitals:   03/15/16 1330 03/15/16 1345  BP: 119/76 111/75  Pulse:  88  Resp:  20  Temp:      Last Pain:  Vitals:   03/15/16 1344  PainSc: 6                  Kourtnie Sachs,JAMES TERRILL

## 2016-03-15 NOTE — Discharge Instructions (Signed)
[facility] ______[facility] LAPAROSCOPIC SURGERY: POST OP INSTRUCTIONS Always review your discharge instruction sheet given to you by the facility where your surgery was performed. IF YOU HAVE DISABILITY OR FAMILY LEAVE FORMS, YOU MUST BRING THEM TO THE OFFICE FOR PROCESSING.   DO NOT GIVE THEM TO YOUR DOCTOR.  1. A prescription for pain medication may be given to you upon discharge.  Take your pain medication as prescribed, if needed.  If narcotic pain medicine is not needed, then you may take acetaminophen (Tylenol) or ibuprofen (Advil) as needed. 2. Take your usually prescribed medications unless otherwise directed. 3. If you need a refill on your pain medication, please contact your pharmacy.  They will contact our office to request authorization. Prescriptions will not be filled after 5pm or on week-ends. 4. You should follow a light diet the first few days after arrival home, such as soup and crackers, etc.  Be sure to include lots of fluids daily. 5. Most patients will experience some swelling and bruising in the area of the incisions.  Ice packs will help.  Swelling and bruising can take several days to resolve.  6. It is common to experience some constipation if taking pain medication after surgery.  Increasing fluid intake and taking a stool softener (such as Colace) will usually help or prevent this problem from occurring.  A mild laxative (Milk of Magnesia or Miralax) should be taken according to package instructions if there are no bowel movements after 48 hours. 7. Unless discharge instructions indicate otherwise, you may remove your bandages 24-48 hours after surgery, and you may shower at that time.  You may have steri-strips (small skin tapes) in place directly over the incision.  These strips should be left on the skin for 7-10 days.  If your surgeon used skin glue on the incision, you may shower in 24 hours.  The glue will flake off over the next 2-3 weeks.  Any sutures or  staples will be removed at the office during your follow-up visit. 8. ACTIVITIES:  You may resume regular (light) daily activities beginning the next day--such as daily self-care, walking, climbing stairs--gradually increasing activities as tolerated.  You may have sexual intercourse when it is comfortable.  Refrain from any heavy lifting or straining until approved by your doctor. a. You may drive when you are no longer taking prescription pain medication, you can comfortably wear a seatbelt, and you can safely maneuver your car and apply brakes. b. RETURN TO WORK:  ____1 week light duty______________________________________________________ 9. You should see your doctor in the office for a follow-up appointment approximately 2-3 weeks after your surgery.  Make sure that you call for this appointment within a day or two after you arrive home to insure a convenient appointment time. 10. OTHER INSTRUCTIONS: __________________________________________________________________________________________________________________________ __________________________________________________________________________________________________________________________ WHEN TO CALL YOUR DOCTOR: 1. Fever over 101.0 2. Inability to urinate 3. Continued bleeding from incision. 4. Increased pain, redness, or drainage from the incision. 5. Increasing abdominal pain  The clinic staff is available to answer your questions during regular business hours.  Please dont hesitate to call and ask to speak to one of the nurses for clinical concerns.  If you have a medical emergency, go to the nearest emergency room or call 911.  A surgeon from [facility] is always on call at the hospital. [address] [place], [place], [place]  [address] ? [address], [place], [place]   [address] [phone] ? [phone] ? FAX [phone]  [phone] Web site: [email or web address]

## 2016-03-15 NOTE — H&P (Signed)
[name] presents for lap chole today. She continues to experience nausea and epigastric pain with recent US showing cholelithiasis and upper-limit of normal GB wall thickness. She denies recent fever, chills, cough, rhinorrhea, chest pain or shortness of breath. She is here with her cousin, Emily Houston.   I have reviewed the patient's medical history in detail and updated the computerized patient record.  Review of systems as above, otherwise negative.   Vitals:   03/15/16 0912  BP: 103/68  Pulse: 90  Resp: 18  Temp: 98.4 F (36.9 C)   Aox3, no distress Unlabored resp. RRR Abdomen soft, obese, nontender Ext WWP Neuro grossly normal Psych appropriate mood/affect   A/P: Proceed to OR for lap chole. Plan on DC home post op.

## 2016-03-15 NOTE — Anesthesia Preprocedure Evaluation (Signed)
Anesthesia Evaluation  Patient identified by MRN, date of birth, ID band Patient awake    Reviewed: Allergy & Precautions, NPO status , Patient's Chart, lab work & pertinent test results  Airway Mallampati: I  TM Distance: >3 FB Neck ROM: Full    Dental   Pulmonary sleep apnea ,    Pulmonary exam normal        Cardiovascular Normal cardiovascular exam     Neuro/Psych    GI/Hepatic GERD  Medicated and Controlled,  Endo/Other    Renal/GU      Musculoskeletal   Abdominal   Peds  Hematology   Anesthesia Other Findings   Reproductive/Obstetrics                             Anesthesia Physical Anesthesia Plan  ASA: III  Anesthesia Plan: General   Post-op Pain Management:    Induction: Intravenous  Airway Management Planned: Oral ETT  Additional Equipment:   Intra-op Plan:   Post-operative Plan: Extubation in OR  Informed Consent: I have reviewed the patients History and Physical, chart, labs and discussed the procedure including the risks, benefits and alternatives for the proposed anesthesia with the patient or authorized representative who has indicated his/her understanding and acceptance.     Plan Discussed with: CRNA and Surgeon  Anesthesia Plan Comments:         Anesthesia Quick Evaluation

## 2016-03-15 NOTE — Progress Notes (Signed)
Patient had labs drawn 8/15.  All WNL.  Preg test neg.  I asked patient if she could be pregnant now, "no" plus she get contraceptive injection back in February. I spoke with Drs. Ossey & Kae Heller and inquired over redrawing labs.  Not necessary. Last took phenteramine 1 month ago.

## 2016-03-15 NOTE — Anesthesia Procedure Notes (Signed)
Procedure Name: Intubation Date/Time: 03/15/2016 11:38 AM Performed by: Myna Bright Pre-anesthesia Checklist: Patient identified, Emergency Drugs available, Suction available, Patient being monitored and Timeout performed Patient Re-evaluated:Patient Re-evaluated prior to inductionOxygen Delivery Method: Circle system utilized Preoxygenation: Pre-oxygenation with 100% oxygen Intubation Type: IV induction Ventilation: Mask ventilation without difficulty Laryngoscope Size: Mac and 3 Grade View: Grade I Tube type: Oral Tube size: 7.0 mm Number of attempts: 1 Airway Equipment and Method: Stylet Placement Confirmation: ETT inserted through vocal cords under direct vision,  positive ETCO2 and breath sounds checked- equal and bilateral Secured at: 22 cm Tube secured with: Tape Dental Injury: Teeth and Oropharynx as per pre-operative assessment

## 2016-03-15 NOTE — Op Note (Signed)
Operative Note  Emily Houston 35 y.o. female [id number]  03/15/2016  Surgeon: Clovis Riley MD  Assistant: Dr. Georgette Dover  Procedure performed: Laparoscopic Cholecystectomy  Preop diagnosis: chronic cholecystitis  Post-op diagnosis/intraop findings: same. Large stone in gallbladder neck.   Specimens: gallbladder  EBL: 5  Complications: none  Description of procedure: After obtaining informed consent the patient was brought to the operating room. Prophylactic antibiotics and subcutaneous heparin were administered. SCD's were applied. General endotracheal anesthesia was initiated and a formal time-out was performed. The abdomen was prepped and draped in the usual sterile fashion and the abdomen was entered using a visiport technique in the left upper abdomen after instilling the site with 0.25%marcaine with epi. Insufflation to 66mmHg was obtained and gross inspection revealed no evidence of injury from our entry or other intraabdominal abnormalities. Three 48mm trocars were introduced in the supraumbilical, right midclavicular and right anterior axillary lines under direct visualization and following infiltration with local. The entry port was upsized to a 40mm trocar. The gallbladder was retracted cephalad and the infundibulum was retracted laterally. A combination of hook electrocautery and blunt dissection was utilized to clear the peritoneum from the neck and cystic duct, circumferentially isolating the cystic artery and cystic duct and lifting the gallbladder from the cystic plate. The critical view of safety was achieved with the cystic artery, cystic duct, and liver bed visualized between them with no other structures. The artery was clipped and divided as was the cystic duct with two clips on the proximal end. The gallbladder was dissected from the liver plate using electrocautery. One small accessory duct was visualized and clipped during this process. Once freed the gallbladder was  placed in an endocatch bag and removed through the epigastric trocar site. A small amount of bleeding on the liver bed was controlled with cautery. Some bile had been spilled from the gallbladder during its dissection from the liver bed. This was aspirated and the right upper quadrant was irrigated copiously until the effluent was clear. Hemostasis was once again confirmed, and reinspection of the abdomen revealed no injuries. The abdomen was desufflated and all trocars removed. The skin incisions were closed with running subcuticular monocryl and liquiban. The patient was awakened, extubated and transported to the recovery room in stable condition.   All counts were correct at the completion of the case.

## 2016-03-15 NOTE — Transfer of Care (Signed)
Immediate Anesthesia Transfer of Care Note  Patient: Emily Houston  Procedure(s) Performed: Procedure(s): LAPAROSCOPIC CHOLECYSTECTOMY (N/A)  Patient Location: PACU  Anesthesia Type:General  Level of Consciousness: awake, alert , oriented and patient cooperative  Airway & Oxygen Therapy: Patient Spontanous Breathing and Patient connected to nasal cannula oxygen  Post-op Assessment: Report given to RN, Post -op Vital signs reviewed and stable and Patient moving all extremities  Post vital signs: Reviewed and stable  Last Vitals:  Vitals:   03/15/16 0912 03/15/16 1250  BP: 103/68 (!) (P) 125/50  Pulse: 90   Resp: 18   Temp: 36.9 C (P) 36.6 C    Last Pain:  Vitals:   03/15/16 1250  PainSc: (P) 0-No pain         Complications: No apparent anesthesia complications

## 2016-03-16 ENCOUNTER — Encounter (HOSPITAL_COMMUNITY): Payer: Self-pay | Admitting: Surgery

## 2016-06-28 ENCOUNTER — Emergency Department (HOSPITAL_COMMUNITY)
Admission: EM | Admit: 2016-06-28 | Discharge: 2016-06-28 | Disposition: A | Discharge status: Home / Self Care | Payer: Medicaid Other | Attending: Emergency Medicine | Admitting: Emergency Medicine

## 2016-06-28 ENCOUNTER — Emergency Department (HOSPITAL_COMMUNITY): Payer: Medicaid Other

## 2016-06-28 ENCOUNTER — Encounter (HOSPITAL_COMMUNITY): Payer: Self-pay | Admitting: Emergency Medicine

## 2016-06-28 DIAGNOSIS — R51 Headache: Secondary | ICD-10-CM | POA: Insufficient documentation

## 2016-06-28 DIAGNOSIS — R519 Headache, unspecified: Secondary | ICD-10-CM

## 2016-06-28 NOTE — ED Provider Notes (Signed)
[facility] DEPT Provider Note   CSN: [id number] Arrival date & time: 06/28/16  1817     History   Chief Complaint Chief Complaint  Patient presents with  . Headache    HPI Emily Houston is a 35 y.o. female history of seasonal allergies, morbid obesity, sleep apnea presents to the ED complaining of left sided headache that woke her up at 2am this morning. She characterizes her pain as sudden onset, sharp, 10/10 when she woke up but was able to walk around, go to work, and states that has improved since the morning. She reports also having pain behind her left eye and it radiating to her ear. She states that it was hard to open her left eye completely at this morning. She states that leaning forward makes it worse. She has not tried any pain medications for her pain. She reports not having a history of headaches or migraine headaches. She recently had a URI 1 month ago and completed course of antibiotics. She denies congestion, sore throat, or tenderness to her temporal region bilaterally. She states she's never had anything similar to this. She denies chest pain, shortness of breath, visual changes, light sensitivity, nausea, vomiting, fevers, chills, trauma, neck pain, neck stiffness, changes in gait.Marland Kitchen  HPI  Past Medical History:  Diagnosis Date  . Cholecystitis    chronic  . GERD (gastroesophageal reflux disease)   . H/O seasonal allergies   . Morbid obesity ([place])   . Sleep apnea    wears CPAP    There are no active problems to display for this patient.   Past Surgical History:  Procedure Laterality Date  . CESAREAN SECTION    . CHOLECYSTECTOMY N/A 03/15/2016   Procedure: LAPAROSCOPIC CHOLECYSTECTOMY;  Surgeon: Clovis Riley, MD;  Location: [facility];  Service: General;  Laterality: N/A;    OB History    No data available       Home Medications    Prior to Admission medications   Medication Sig Start Date End Date Taking? Authorizing Provider  fluticasone  (FLONASE) 50 MCG/ACT nasal spray U 1 SPRAY IEN ONCE A DAY AS NEEDED FOR ALLERGIES 01/27/16   Historical Provider, MD  HYDROcodone-acetaminophen (NORCO/VICODIN) 5-325 MG tablet Take 1 tablet by mouth every 6 (six) hours as needed for moderate pain. Patient not taking: Reported on 06/28/2016 03/15/16   Clovis Riley, MD  MedroxyPROGESTERone Acetate 150 MG/ML SUSY INJECT 1 ML INTO THE MUSCLE Q 90 DAYS 01/25/16   Historical Provider, MD  ondansetron (ZOFRAN) 4 MG tablet Take 1 tablet (4 mg total) by mouth every 6 (six) hours. Patient not taking: Reported on 06/28/2016 03/01/16   Tatyana Kirichenko, PA-C  pantoprazole (PROTONIX) 20 MG tablet Take 1 tablet (20 mg total) by mouth daily. Patient not taking: Reported on 06/28/2016 03/01/16   Tatyana Kirichenko, PA-C  phentermine (ADIPEX-P) 37.5 MG tablet Take 37.5 mg by mouth daily before breakfast.    Historical Provider, MD    Family History Family History  Problem Relation Age of Onset  . Hypertension Father   . Heart disease Other     Social History Social History  Substance Use Topics  . Smoking status: Never Smoker  . Smokeless tobacco: Never Used  . Alcohol use No     Allergies   Penicillins and Aspirin   Review of Systems Review of Systems  Constitutional: Negative for chills and fever.  HENT: Negative for congestion, hearing loss, sinus pain, sinus pressure and sore throat.  Eyes: Negative for photophobia.       Pain behind left eye.   Respiratory: Negative for shortness of breath.   Cardiovascular: Negative for chest pain.  Gastrointestinal: Negative for abdominal pain, constipation, diarrhea, nausea and vomiting.  Genitourinary: Negative for difficulty urinating and dysuria.  Musculoskeletal: Negative for gait problem, neck pain and neck stiffness.  Skin: Negative for wound.  Neurological: Positive for headaches. Negative for seizures, syncope and speech difficulty.     Physical Exam Updated Vital Signs BP 139/89 (BP  Location: Left Arm)   Pulse 77   Temp 98.5 F (36.9 C) (Oral)   Resp 16   Ht 5\' 6"  (1.676 m)   Wt 111.1 kg   SpO2 100%   BMI 39.54 kg/m   Physical Exam  Constitutional: She is oriented to person, place, and time. She appears well-developed and well-nourished.  HENT:  Head: Normocephalic and atraumatic.  Right Ear: External ear normal.  Left Ear: External ear normal.  Nose: Nose normal.  Mouth/Throat: Oropharynx is clear and moist.  No tenderness to back of neck. No nuchal rigidity.  Eyes: Conjunctivae and EOM are normal. Pupils are equal, round, and reactive to light.  Neck: Normal range of motion. Neck supple.  Cardiovascular: Normal rate and normal heart sounds.   Pulmonary/Chest: Effort normal and breath sounds normal. No respiratory distress. She exhibits no tenderness.  Abdominal: Soft. Bowel sounds are normal. There is no tenderness. There is no rebound and no guarding.  Musculoskeletal: Normal range of motion. She exhibits no tenderness.  Neurological: She is alert and oriented to person, place, and time. No sensory deficit.  Cranial Nerves:  III,IV, VI: ptosis not present, extra-ocular movements intact bilaterally, direct and consensual pupillary light reflexes intact bilaterally V: facial sensation, jaw opening, and bite strength equal bilaterally VII: eyebrow raise, eyelid close, smile, frown, pucker equal bilaterally VIII: hearing grossly normal bilaterally  IX,X: palate elevation and swallowing intact XI: bilateral shoulder shrug and lateral head rotation equal and strong XII: midline tongue extension  Sensation is strength intact and equal bilaterally. Negative pronator drift, negative finger-nose, negative heel-to-shin, negative , negative Romberg. Normal gait.  Negative Brudzinski's sign. Negative Kernig sign.  Skin: Skin is warm. Capillary refill takes less than 2 seconds.  Psychiatric: She has a normal mood and affect. Her behavior is normal.  Nursing note  and vitals reviewed.    ED Treatments / Results  Labs (all labs ordered are listed, but only abnormal results are displayed) Labs Reviewed - No data to display  EKG  EKG Interpretation None       Radiology Ct Head Wo Contrast  Result Date: 06/28/2016 CLINICAL DATA:  Initial evaluation for acute left-sided headache, eye pain. EXAM: CT HEAD WITHOUT CONTRAST TECHNIQUE: Contiguous axial images were obtained from the base of the skull through the vertex without intravenous contrast. COMPARISON:  None. FINDINGS: Brain: Cerebral volume normal. Gray-white matter differentiation well maintained. No acute intracranial hemorrhage. No evidence for acute large vessel territory infarct. No mass lesion, midline shift, or mass effect. No hydrocephalus. No extra-axial fluid collection. Vascular: No hyperdense vessel. Skull: Scalp soft tissues within normal limits.  Calvarium intact. Sinuses/Orbits: Globes and orbital soft tissues within normal limits. Paranasal sinuses and mastoid air cells are clear. IMPRESSION: Negative head CT.  No acute intracranial process identified. Electronically Signed   By: Jeannine Boga M.D.   On: 06/28/2016 20:24    Procedures Procedures (including critical care time)  Medications Ordered in ED Medications - No data  to display   Initial Impression / Assessment and Plan / ED Course  I have reviewed the triage vital signs and the nursing notes.  Pertinent labs & imaging results that were available during my care of the patient were reviewed by me and considered in my medical decision making (see chart for details).  Clinical Course   Patient is a 35 year old female presenting with left-sided headache that began suddenly and woke her up this morning at 2 AM. On exam patient NAD, afebrile and hemodynamically stable. Heart and lung sounds are clear. Negative neuro exam. Negative pronator drift, negative finger-nose, negative RAM, negative heel to shin, sensation and  strength intact and equal bilaterally. Patient states that her pain has improved since this morning but still lingering. Patient denied any pain medication. CT negative for any mass lesion, hemorrhage, or any acute findings. Patient does not have any neck pain or nuchal rigidity as time. Patient afebrile, NAD, and hemodynamically stable. Feels safe to discharge at this time. Patient understood. Patient agrees with assessment and plan. Patient encouraged to schedule appointment with neurologist and her primary care physician in 2-5 days. Return precautions given for any new or worsening symptoms such as severe headache, vomiting, stiff neck, loss of vision, problems with speech, fever.   Final Clinical Impressions(s) / ED Diagnoses   Final diagnoses:  Nonintractable headache, unspecified chronicity pattern, unspecified headache type    New Prescriptions New Prescriptions   No medications on file     [place], [place] 06/28/16 2144    Malvin Johns, MD 06/28/16 2359

## 2016-06-28 NOTE — ED Triage Notes (Signed)
Patient states that started having left sided headache and eye pain that has continued today. Patient denies any blurred vision, light sensitivity, or nausea.

## 2016-06-28 NOTE — Discharge Instructions (Signed)
Please follow up with [facility] neurology or [facility] neurology in 2-5 days. Please follow-up with your primary care physician in 2-5 days for follow-up.  Get help right away if: Your headache becomes severe. You have repeated vomiting. You have a stiff neck. You have a loss of vision. You have problems with speech. You have pain in the eye or ear. You have muscular weakness or loss of muscle control. You lose your balance or have trouble walking. You feel faint or pass out. You have confusion.

## 2016-07-28 ENCOUNTER — Encounter: Payer: Self-pay | Admitting: Gastroenterology

## 2016-08-12 ENCOUNTER — Other Ambulatory Visit: Payer: Self-pay | Admitting: Physician Assistant

## 2016-08-12 DIAGNOSIS — N921 Excessive and frequent menstruation with irregular cycle: Secondary | ICD-10-CM

## 2016-08-12 DIAGNOSIS — N946 Dysmenorrhea, unspecified: Secondary | ICD-10-CM

## 2016-08-19 ENCOUNTER — Ambulatory Visit
Admission: RE | Admit: 2016-08-19 | Discharge: 2016-08-19 | Disposition: A | Discharge status: Home / Self Care | Payer: Medicaid Other | Source: Ambulatory Visit | Attending: Physician Assistant | Admitting: Physician Assistant

## 2016-08-19 DIAGNOSIS — N921 Excessive and frequent menstruation with irregular cycle: Secondary | ICD-10-CM

## 2016-08-19 DIAGNOSIS — N946 Dysmenorrhea, unspecified: Secondary | ICD-10-CM

## 2016-09-01 ENCOUNTER — Encounter: Payer: Self-pay | Admitting: Gastroenterology

## 2016-09-01 ENCOUNTER — Ambulatory Visit (INDEPENDENT_AMBULATORY_CARE_PROVIDER_SITE_OTHER): Payer: Medicaid Other | Admitting: Gastroenterology

## 2016-09-01 VITALS — BP 90/60 | HR 64 | Ht 66.0 in | Wt 256.8 lb

## 2016-09-01 DIAGNOSIS — R110 Nausea: Secondary | ICD-10-CM | POA: Diagnosis not present

## 2016-09-01 DIAGNOSIS — K5900 Constipation, unspecified: Secondary | ICD-10-CM | POA: Diagnosis not present

## 2016-09-01 MED ORDER — OMEPRAZOLE 20 MG PO CPDR: 20.0000 mg | DELAYED_RELEASE_CAPSULE | Freq: Every day | ORAL | 11 refills | Status: DC

## 2016-09-01 MED ORDER — ONDANSETRON HCL 4 MG PO TABS
4.0000 mg | ORAL_TABLET | Freq: Four times a day (QID) | ORAL | 2 refills | Status: DC | PRN
Start: 2016-09-01 — End: 2017-11-16

## 2016-09-01 NOTE — Progress Notes (Signed)
History of Present Illness: This is a 36 year old female referred by Emily Riley, MD for the evaluation of nausea, constipation. She is S/P lap chole in 02/2016. She had frequent nausea, vomiting and abdominal pain prior to her lap chole. Since then all symptoms improved except for frequent problems with nausea occurring after larger meals but not with smaller meals. She has chronic constipation and occasionally after her bowels have not moved for several days she will have diarrhea. She has no abdominal pain at this time. PPIs been tried on several occasions with no change in symptoms. She states Zofran has helped her nausea. She has gained weight steadily since her lap chole and states she lost about 50 pounds prior to her lap chole. Denies weight loss, abdominal pain, change in stool caliber, melena, hematochezia, vomiting, dysphagia, reflux symptoms, chest pain.  Allergies  Allergen Reactions  . Penicillins Other (See Comments)    UNSPECIFIED   . Aspirin Other (See Comments)    "Dizziness."    Outpatient Medications Prior to Visit  Medication Sig Dispense Refill  . fluticasone (FLONASE) 50 MCG/ACT nasal spray U 1 SPRAY IEN ONCE A DAY AS NEEDED FOR ALLERGIES  0  . MedroxyPROGESTERone Acetate 150 MG/ML SUSY INJECT 1 ML INTO THE MUSCLE Q 90 DAYS  1  . phentermine (ADIPEX-P) 37.5 MG tablet Take 37.5 mg by mouth daily before breakfast.    . HYDROcodone-acetaminophen (NORCO/VICODIN) 5-325 MG tablet Take 1 tablet by mouth every 6 (six) hours as needed for moderate pain. (Patient not taking: Reported on 06/28/2016) 30 tablet 0  . ondansetron (ZOFRAN) 4 MG tablet Take 1 tablet (4 mg total) by mouth every 6 (six) hours. (Patient not taking: Reported on 06/28/2016) 12 tablet 0  . pantoprazole (PROTONIX) 20 MG tablet Take 1 tablet (20 mg total) by mouth daily. (Patient not taking: Reported on 06/28/2016) 30 tablet 0   No facility-administered medications prior to visit.    Past Medical  History:  Diagnosis Date  . Cholecystitis    chronic  . GERD (gastroesophageal reflux disease)   . H/O seasonal allergies   . Morbid obesity ([place])   . Sleep apnea    wears CPAP   Past Surgical History:  Procedure Laterality Date  . CESAREAN SECTION    . CHOLECYSTECTOMY N/A 03/15/2016   Procedure: LAPAROSCOPIC CHOLECYSTECTOMY;  Surgeon: Emily Riley, MD;  Location: [facility];  Service: General;  Laterality: N/A;   Social History   Social History  . Marital status: Single    Spouse name: N/A  . Number of children: 1  . Years of education: N/A   Social History Main Topics  . Smoking status: Never Smoker  . Smokeless tobacco: Never Used  . Alcohol use No  . Drug use: No  . Sexual activity: Yes   Other Topics Concern  . None   Social History Narrative  . None   Family History  Problem Relation Age of Onset  . Hypertension Father   . Heart disease Other     Review of Systems: Pertinent positive and negative review of systems were noted in the above HPI section. All other review of systems were otherwise negative.   Physical Exam: General: Well developed, well nourished, no acute distress Head: Normocephalic and atraumatic Eyes:  sclerae anicteric, EOMI Ears: Normal auditory acuity Mouth: No deformity or lesions Neck: Supple, no masses or thyromegaly Lungs: Clear throughout to auscultation Heart: Regular rate and rhythm; no murmurs, rubs or bruits  Abdomen: Soft, non tender and non distended. No masses, hepatosplenomegaly or hernias noted. Normal Bowel sounds Rectal: not done Musculoskeletal: Symmetrical with no gross deformities  Skin: No lesions on visible extremities Pulses:  Normal pulses noted Extremities: No clubbing, cyanosis, edema or deformities noted Neurological: Alert oriented x 4, grossly nonfocal Cervical Nodes:  No significant cervical adenopathy Inguinal Nodes: No significant inguinal adenopathy Psychological:  Alert and cooperative. Normal mood  and affect  Assessment and Recommendations:  1. Postprandial nausea and chronic constipation. Change MiraLAX from qd prn to daily. Begin omeprazole 20 mg daily and Zofran 4 mg qid prn. Schedule EGD. If EGD is unremarkable schedule gastric emptying scan. The risks (including bleeding, perforation, infection, missed lesions, medication reactions and possible hospitalization or surgery if complications occur), benefits, and alternatives to endoscopy with possible biopsy and possible dilation were discussed with the patient and they consent to proceed.    cc: Emily Riley, MD No address on file

## 2016-09-01 NOTE — Patient Instructions (Signed)
We have sent the following medications to your pharmacy for you to pick up at your convenience: Zofran and omeprazole.   Stay on your Miralax daily.   You have been scheduled for an endoscopy. Please follow written instructions given to you at your visit today. If you use inhalers (even only as needed), please bring them with you on the day of your procedure. Your physician has requested that you go to www.startemmi.com and enter the access code given to you at your visit today. This web site gives a general overview about your procedure. However, you should still follow specific instructions given to you by our office regarding your preparation for the procedure.  Normal BMI (Body Mass Index- based on height and weight) is between 19 and 25. Your BMI today is Body mass index is 41.45 kg/m. Marland Kitchen Please consider follow up  regarding your BMI with your Primary Care Provider.   Thank you for choosing me and [facility].  Pricilla Riffle. Dagoberto Ligas., MD., Marval Regal

## 2016-10-19 ENCOUNTER — Ambulatory Visit (AMBULATORY_SURGERY_CENTER): Payer: Medicaid Other | Admitting: Gastroenterology

## 2016-10-19 ENCOUNTER — Encounter: Payer: Self-pay | Admitting: Gastroenterology

## 2016-10-19 VITALS — BP 127/80 | HR 78 | Temp 98.9°F | Resp 19 | Ht 66.0 in | Wt 256.0 lb

## 2016-10-19 DIAGNOSIS — R110 Nausea: Secondary | ICD-10-CM

## 2016-10-19 MED ORDER — SODIUM CHLORIDE 0.9 % IV SOLN: 500.0000 mL | INTRAVENOUS | Status: DC

## 2016-10-19 NOTE — Progress Notes (Signed)
Pt's states no medical or surgical changes since previsit or office visit. 

## 2016-10-19 NOTE — Progress Notes (Signed)
Report to PACU, RN, vss, BBS= Clear.  

## 2016-10-19 NOTE — Patient Instructions (Signed)
YOU HAD AN ENDOSCOPIC PROCEDURE TODAY: Refer to the procedure report and other information in the discharge instructions given to you for any specific questions about what was found during the examination. If this information does not answer your questions, please call [place] office at [phone] to clarify.   YOU SHOULD EXPECT: Some feelings of bloating in the abdomen. Passage of more gas than usual. Walking can help get rid of the air that was put into your GI tract during the procedure and reduce the bloating. If you had a lower endoscopy (such as a colonoscopy or flexible sigmoidoscopy) you may notice spotting of blood in your stool or on the toilet paper. Some abdominal soreness may be present for a day or two, also.  DIET: Your first meal following the procedure should be a light meal and then it is ok to progress to your normal diet. A half-sandwich or bowl of soup is an example of a good first meal. Heavy or fried foods are harder to digest and may make you feel nauseous or bloated. Drink plenty of fluids but you should avoid alcoholic beverages for 24 hours. If you had a esophageal dilation, please see attached instructions for diet.    ACTIVITY: Your care partner should take you home directly after the procedure. You should plan to take it easy, moving slowly for the rest of the day. You can resume normal activity the day after the procedure however YOU SHOULD NOT DRIVE, use power tools, machinery or perform tasks that involve climbing or major physical exertion for 24 hours (because of the sedation medicines used during the test).   SYMPTOMS TO REPORT IMMEDIATELY: A gastroenterologist can be reached at any hour. Please call [phone]  for any of the following symptoms:   Following upper endoscopy (EGD, EUS, ERCP, esophageal dilation) Vomiting of blood or coffee ground material  New, significant abdominal pain  New, significant chest pain or pain under the shoulder blades  Painful or  persistently difficult swallowing  New shortness of breath  Black, tarry-looking or red, bloody stools  FOLLOW UP:  If any biopsies were taken you will be contacted by phone or by letter within the next 1-3 weeks. Call [phone]  if you have not heard about the biopsies in 3 weeks.  Please also call with any specific questions about appointments or follow up tests.  

## 2016-10-19 NOTE — Op Note (Signed)
[place] Patient Name: Emily Houston Procedure Date: 10/19/2016 3:41 PM MRN: [id number] Endoscopist: Ladene Artist , MD Age: 36 Referring MD:  Date of Birth: Sep 03, 1980 Gender: Female Account #: [id number] Procedure:                Upper GI endoscopy Indications:              Nausea Medicines:                Monitored Anesthesia Care Procedure:                Pre-Anesthesia Assessment:                           - Prior to the procedure, a History and Physical                            was performed, and patient medications and                            allergies were reviewed. The patient's tolerance of                            previous anesthesia was also reviewed. The risks                            and benefits of the procedure and the sedation                            options and risks were discussed with the patient.                            All questions were answered, and informed consent                            was obtained. Prior Anticoagulants: The patient has                            taken no previous anticoagulant or antiplatelet                            agents. ASA Grade Assessment: III - A patient with                            severe systemic disease. After reviewing the risks                            and benefits, the patient was deemed in                            satisfactory condition to undergo the procedure.                           After obtaining informed consent, the endoscope was  passed under direct vision. Throughout the                            procedure, the patient's blood pressure, pulse, and                            oxygen saturations were monitored continuously. The                            Endoscope was introduced through the mouth, and                            advanced to the second part of duodenum. The upper                            GI endoscopy was accomplished without  difficulty.                            The patient tolerated the procedure well. Scope In: Scope Out: 3:55:28 PM Findings:                 The examined esophagus was normal.                           The entire examined stomach was normal.                           The duodenal bulb and second portion of the                            duodenum were normal. Complications:            No immediate complications. Estimated Blood Loss:     Estimated blood loss: none. Impression:               - Normal esophagus.                           - Normal stomach.                           - Normal duodenal bulb and second portion of the                            duodenum.                           - No specimens collected. Recommendation:           - Patient has a contact number available for                            emergencies. The signs and symptoms of potential                            delayed complications were discussed with the  patient. Return to normal activities tomorrow.                            Written discharge instructions were provided to the                            patient.                           - Resume previous diet.                           - Continue present medications.                           - Schedule gastric emptying study at the next                            available appointment. Ladene Artist, MD 10/19/2016 3:58:47 PM This report has been signed electronically.

## 2016-10-20 ENCOUNTER — Telehealth: Payer: Self-pay | Admitting: *Deleted

## 2016-10-20 NOTE — Telephone Encounter (Signed)
  Follow up Call-  Call back number 10/19/2016  Post procedure Call Back phone  # [phone]  Permission to leave phone message Yes     Patient questions:  Do you have a fever, pain , or abdominal swelling? No. Pain Score  0 *  Have you tolerated food without any problems? Yes.    Have you been able to return to your normal activities? Yes.    Do you have any questions about your discharge instructions: Diet   No. Medications  No. Follow up visit  No.  Do you have questions or concerns about your Care? No.  Actions: * If pain score is 4 or above: No action needed, pain <4.

## 2016-10-20 NOTE — Telephone Encounter (Signed)
Message left

## 2016-10-21 ENCOUNTER — Other Ambulatory Visit: Payer: Self-pay

## 2016-10-21 DIAGNOSIS — R1013 Epigastric pain: Secondary | ICD-10-CM

## 2016-10-21 DIAGNOSIS — R110 Nausea: Secondary | ICD-10-CM

## 2016-10-21 NOTE — Progress Notes (Signed)
Per procedure report on 10/19/16 patient needs to be scheduled for GES.  Appt is scheduled for 10/27/16 at [facility] 7:15 arrival for a 7:30 appt.  She will need to be NPO after midnight.  She will need to hold her phentermine, zofran, and prilosec for 24 hour prior to the test.   Left message for patient to call back

## 2016-10-24 NOTE — Progress Notes (Signed)
Left message for patient to call back  

## 2016-10-25 NOTE — Progress Notes (Signed)
Patient notified of the appt details.  

## 2016-10-25 NOTE — Progress Notes (Signed)
Left message for patient to call back  

## 2016-10-27 ENCOUNTER — Ambulatory Visit (HOSPITAL_COMMUNITY)
Admission: RE | Admit: 2016-10-27 | Discharge: 2016-10-27 | Disposition: A | Discharge status: Home / Self Care | Payer: Medicaid Other | Source: Ambulatory Visit | Attending: Gastroenterology | Admitting: Gastroenterology

## 2016-10-27 DIAGNOSIS — R1013 Epigastric pain: Secondary | ICD-10-CM | POA: Diagnosis present

## 2016-10-27 DIAGNOSIS — R110 Nausea: Secondary | ICD-10-CM | POA: Diagnosis not present

## 2016-10-27 MED ORDER — TECHNETIUM TC 99M SULFUR COLLOID
2.0000 | Freq: Once | INTRAVENOUS | Status: AC | PRN
  Administered 2016-10-27: 2 via INTRAVENOUS

## 2016-11-05 ENCOUNTER — Encounter (HOSPITAL_COMMUNITY): Payer: Self-pay | Admitting: Emergency Medicine

## 2016-11-05 ENCOUNTER — Emergency Department (HOSPITAL_COMMUNITY): Payer: Medicaid Other

## 2016-11-05 ENCOUNTER — Emergency Department (HOSPITAL_COMMUNITY)
Admission: EM | Admit: 2016-11-05 | Discharge: 2016-11-06 | Disposition: A | Discharge status: Home / Self Care | Payer: Medicaid Other | Attending: Emergency Medicine | Admitting: Emergency Medicine

## 2016-11-05 DIAGNOSIS — R071 Chest pain on breathing: Secondary | ICD-10-CM | POA: Diagnosis present

## 2016-11-05 DIAGNOSIS — Z79899 Other long term (current) drug therapy: Secondary | ICD-10-CM | POA: Insufficient documentation

## 2016-11-05 DIAGNOSIS — E041 Nontoxic single thyroid nodule: Secondary | ICD-10-CM | POA: Insufficient documentation

## 2016-11-05 DIAGNOSIS — R079 Chest pain, unspecified: Secondary | ICD-10-CM

## 2016-11-05 LAB — CBC
HEMATOCRIT: 38 % (ref 36.0–46.0)
Hemoglobin: 12.6 g/dL (ref 12.0–15.0)
MCH: 27 pg (ref 26.0–34.0)
MCHC: 33.2 g/dL (ref 30.0–36.0)
MCV: 81.4 fL (ref 78.0–100.0)
Platelets: 235 10*3/uL (ref 150–400)
RBC: 4.67 MIL/uL (ref 3.87–5.11)
RDW: 14.6 % (ref 11.5–15.5)
WBC: 7.1 10*3/uL (ref 4.0–10.5)

## 2016-11-05 LAB — BASIC METABOLIC PANEL
ANION GAP: 5 (ref 5–15)
BUN: 8 mg/dL (ref 6–20)
CALCIUM: 8.5 mg/dL — AB (ref 8.9–10.3)
CO2: 25 mmol/L (ref 22–32)
Chloride: 105 mmol/L (ref 101–111)
Creatinine, Ser: 0.78 mg/dL (ref 0.44–1.00)
GFR calc Af Amer: >60 mL/min (ref >60)
Glucose, Bld: 80 mg/dL (ref 65–99)
POTASSIUM: 3.8 mmol/L (ref 3.5–5.1)
Sodium: 135 mmol/L (ref 135–145)

## 2016-11-05 LAB — I-STAT TROPONIN, ED: TROPONIN I, POC: 0 ng/mL (ref 0.00–0.08)

## 2016-11-05 LAB — D-DIMER, QUANTITATIVE (NOT AT ARMC): D DIMER QUANT: 0.8 ug{FEU}/mL — AB (ref 0.00–0.50)

## 2016-11-05 MED ORDER — IOPAMIDOL (ISOVUE-370) INJECTION 76%
100.0000 mL | Freq: Once | INTRAVENOUS | Status: AC | PRN
  Administered 2016-11-06: 100 mL via INTRAVENOUS

## 2016-11-05 MED ORDER — GI COCKTAIL ~~LOC~~
30.0000 mL | Freq: Once | ORAL | Status: AC
  Administered 2016-11-05: 30 mL via ORAL
  Filled 2016-11-05: qty 30

## 2016-11-05 MED ORDER — MORPHINE SULFATE (PF) 4 MG/ML IV SOLN
2.0000 mg | Freq: Once | INTRAVENOUS | Status: AC
  Administered 2016-11-05: 2 mg via INTRAVENOUS
  Filled 2016-11-05: qty 1

## 2016-11-05 MED ORDER — KETOROLAC TROMETHAMINE 30 MG/ML IJ SOLN
15.0000 mg | Freq: Once | INTRAMUSCULAR | Status: AC
  Administered 2016-11-05: 15 mg via INTRAVENOUS
  Filled 2016-11-05: qty 1

## 2016-11-05 NOTE — ED Provider Notes (Signed)
[place] DEPT Provider Note   CSN: [id number] Arrival date & time: 11/05/16  2115  By signing my name below, I, Lise Auer, attest that this documentation has been prepared under the direction and in the presence of Ocie Cornfield, PA-C. Electronically Signed: Lise Auer, ED Scribe. 11/05/16. 9:36 PM.  History   Chief Complaint Chief Complaint  Patient presents with  . Chest Pain   HPI Comments: Emily Houston is a 36 y.o. female with a PMHx of GERD, obesity, and OSA, who presents to the Emergency Department complaining of intermittent episodes of centralized chest pain with an onset 3 days ago. Per pt, she was at rest three days ago when she had a sudden onset of aching centralized chest pain. She describes her chest pain as generally aching but at this time describes her pain as dull and more mild. She notes having associated shortness of breath and states her pain is exacerbated with mild exertion and with deep inspirations. She also notes that her pain is intermittently pleuritic and sharp with some deep inspirations. Pt notes having oral surgery x 4 days ago and her chest pain began the day after. Her pain is also worse in the morning following waking up. Further noted, today she had an episode where was walking and her chest pain returned and acutely worsened from her previous episodes of pain, which brought her into the ED. She is current on BC injections. No h/o PE/DVT, recent long travel, fracture, prolonged immobilization. She denies having a cardiac hx, but no known FHx of sudden cardiac death prior to the age of 36yo. She denies Fever, chills, headache, vision changes, lightheadedness, dizziness, abd pain diaphoresis, nausea, vomiting, pain or swelling to the lower extremities.  Patient also complains of pain to her teeth that she had extracted 4 days ago. She is on antibiotics. She does have narcotic pain medicine at home. Complained of mild facial swelling.   The  history is provided by the patient. No language interpreter was used.   Past Medical History:  Diagnosis Date  . Cholecystitis    chronic  . GERD (gastroesophageal reflux disease)   . H/O seasonal allergies   . Morbid obesity ([place])   . Sleep apnea    wears CPAP    There are no active problems to display for this patient.   Past Surgical History:  Procedure Laterality Date  . CESAREAN SECTION    . CHOLECYSTECTOMY N/A 03/15/2016   Procedure: LAPAROSCOPIC CHOLECYSTECTOMY;  Surgeon: Clovis Riley, MD;  Location: [facility];  Service: General;  Laterality: N/A;    OB History    No data available       Home Medications    Prior to Admission medications   Medication Sig Start Date End Date Taking? Authorizing Provider  fluticasone (FLONASE) 50 MCG/ACT nasal spray U 1 SPRAY IEN ONCE A DAY AS NEEDED FOR ALLERGIES 01/27/16   Historical Provider, MD  ibuprofen (ADVIL,MOTRIN) 800 MG tablet Take 1 tablet by mouth 3 (three) times daily as needed. 08/10/16   Historical Provider, MD  MedroxyPROGESTERone Acetate 150 MG/ML SUSY INJECT 1 ML INTO THE MUSCLE Q 90 DAYS 01/25/16   Historical Provider, MD  omeprazole (PRILOSEC) 20 MG capsule Take 1 capsule (20 mg total) by mouth daily. Patient not taking: Reported on 10/19/2016 09/01/16   Ladene Artist, MD  ondansetron (ZOFRAN) 4 MG tablet Take 1 tablet (4 mg total) by mouth 4 (four) times daily as needed for nausea or vomiting. Patient not  taking: Reported on 10/19/2016 09/01/16   Ladene Artist, MD  phentermine (ADIPEX-P) 37.5 MG tablet Take 37.5 mg by mouth daily before breakfast.    Historical Provider, MD  tiZANidine (ZANAFLEX) 4 MG tablet Take 1 tablet by mouth every 8 (eight) hours as needed. 08/11/16   Historical Provider, MD    Family History Family History  Problem Relation Age of Onset  . Hypertension Father   . Heart disease Other     Social History Social History  Substance Use Topics  . Smoking status: Never Smoker  . Smokeless  tobacco: Never Used  . Alcohol use No     Allergies   Penicillins and Aspirin   Review of Systems Review of Systems  Constitutional: Negative for chills and fever.  HENT: Positive for facial swelling. Negative for congestion.   Eyes: Negative for visual disturbance.  Respiratory: Positive for shortness of breath. Negative for cough and chest tightness.   Cardiovascular: Positive for chest pain.  Gastrointestinal: Negative for abdominal pain, diarrhea, nausea and vomiting.  Skin: Negative.   Neurological: Negative for dizziness, syncope, facial asymmetry, weakness, light-headedness, numbness and headaches.  All other systems reviewed and are negative.    Physical Exam Updated Vital Signs BP 105/74   Pulse 69   Temp 97.9 F (36.6 C) (Oral)   Resp 19   Ht 5\' 6"  (1.676 m)   Wt 115.7 kg   SpO2 100%   BMI 41.16 kg/m   Physical Exam  Constitutional: She is oriented to person, place, and time. She appears well-developed and well-nourished. No distress.  Nontoxic appearing.   HENT:  Head: Normocephalic and atraumatic.  Mouth/Throat: Uvula is midline, oropharynx is clear and moist and mucous membranes are normal.    No facial swelling nted no facial swelling noted. No sublingual or submandibular swelling. Oropharynx clear. Able tolerate secretions and maintaining airway.  Eyes: Conjunctivae are normal. Right eye exhibits no discharge. Left eye exhibits no discharge. No scleral icterus.  Neck: Normal range of motion. Neck supple. No thyromegaly present.  Cardiovascular: Normal rate, regular rhythm, normal heart sounds and intact distal pulses.  Exam reveals no friction rub.   No murmur heard. Pulmonary/Chest: Effort normal and breath sounds normal. No respiratory distress. She has no wheezes. She has no rales. She exhibits tenderness.  No hypoxia or tachypnea noted  Abdominal: Soft. Bowel sounds are normal. She exhibits no distension. There is no tenderness. There is no  rebound and no guarding.  Musculoskeletal: Normal range of motion.  Lymphadenopathy:    She has no cervical adenopathy.  Neurological: She is alert and oriented to person, place, and time.  Skin: Skin is warm and dry. Capillary refill takes less than 2 seconds.  Psychiatric: She has a normal mood and affect.  Nursing note and vitals reviewed.    ED Treatments / Results   DIAGNOSTIC STUDIES: Oxygen Saturation is 100% on RA, normal by my interpretation.   COORDINATION OF CARE: 10:02 PM-Discussed next steps with pt. Pt verbalized understanding and is agreeable with the plan.   Labs (all labs ordered are listed, but only abnormal results are displayed) Labs Reviewed  BASIC METABOLIC PANEL - Abnormal; Notable for the following:       Result Value   Calcium 8.5 (*)    All other components within normal limits  D-DIMER, QUANTITATIVE (NOT AT [facility]) - Abnormal; Notable for the following:    D-Dimer, Quant 0.80 (*)    All other components within normal limits  CBC  Randolm Idol, ED  Randolm Idol, ED   EKG  EKG Interpretation None       Radiology Dg Chest 2 View  Result Date: 11/05/2016 CLINICAL DATA:  Chest pain and shortness of breath for the past 3 days. EXAM: CHEST  2 VIEW COMPARISON:  03/24/2014. FINDINGS: The heart size and mediastinal contours are within normal limits. Both lungs are clear. The visualized skeletal structures are unremarkable. IMPRESSION: Normal examination. Electronically Signed   By: Claudie Revering M.D.   On: 11/05/2016 22:00   Ct Angio Chest Pe W/cm &/or Wo Cm  Result Date: 11/06/2016 CLINICAL DATA:  Acute onset of central chest pain and shortness of breath. Initial encounter. EXAM: CT ANGIOGRAPHY CHEST WITH CONTRAST TECHNIQUE: Multidetector CT imaging of the chest was performed using the standard protocol during bolus administration of intravenous contrast. Multiplanar CT image reconstructions and MIPs were obtained to evaluate the vascular  anatomy. CONTRAST:  100 mL of Isovue 370 IV contrast COMPARISON:  Chest radiograph performed 11/05/2016, and CTA of the chest performed 03/24/2014 FINDINGS: Cardiovascular: There is no evidence of central pulmonary embolus. Evaluation for pulmonary embolus is suboptimal due to limitations in the timing of the contrast bolus. The heart is normal in size. The thoracic aorta is grossly unremarkable. The great vessels are within normal limits. Mediastinum/Nodes: The mediastinum is unremarkable in appearance. No mediastinal lymphadenopathy is seen. No pericardial effusion is identified. There is diffuse enlargement of the thyroid, larger on the left. This may reflect a thyroid goiter, increased in size from 2015. No axillary lymphadenopathy is seen. Lungs/Pleura: The lungs are clear bilaterally. No focal consolidation, pleural effusion or pneumothorax is seen. No masses are identified. Upper Abdomen: The visualized portions of the liver and spleen are grossly unremarkable. The visualized portions of the pancreas, adrenal glands and kidneys are within normal limits. Musculoskeletal: No acute osseous abnormalities are identified. The visualized musculature is unremarkable in appearance. Review of the MIP images confirms the above findings. IMPRESSION: 1. No evidence of central pulmonary embolus. Evaluation for pulmonary embolus is suboptimal due to limitations in the timing of the contrast bolus. 2. Lungs clear bilaterally. 3. Thyroid goiter again noted, increased in size from 2015. Thyroid ultrasound is recommended for further evaluation, when and as deemed clinically appropriate. Electronically Signed   By: Garald Balding M.D.   On: 11/06/2016 00:30    Procedures Procedures (including critical care time)  Medications Ordered in ED Medications  iopamidol (ISOVUE-370) 76 % injection (not administered)  gi cocktail (Maalox,Lidocaine,Donnatal) (30 mLs Oral Given 11/05/16 2237)  morphine 4 MG/ML injection 2 mg (2 mg  Intravenous Given 11/05/16 2349)  ketorolac (TORADOL) 30 MG/ML injection 15 mg (15 mg Intravenous Given 11/05/16 2348)  iopamidol (ISOVUE-370) 76 % injection 100 mL (100 mLs Intravenous Contrast Given 11/06/16 0007)     Initial Impression / Assessment and Plan / ED Course  I have reviewed the triage vital signs and the nursing notes.  Pertinent labs & imaging results that were available during my care of the patient were reviewed by me and considered in my medical decision making (see chart for details).     Patient presents to the ED with substernal chest pain is worse with inspiration. Recent oral surgery one day prior to onset of chest pain. Patient says chest pain has been intermittent for the past 3 days acutely worsening today. Patient also complains of pain to the molars where they were extracted by dentist. No leukocytosis noted. First troponin was negative. Electrolytes are normal.  The patient has an elevated d-dimer. CAT scan shows no central pulmonary embolism. Patient is not hypoxic. No tachypnea. No tachycardia. Patient able to ambulate with normal saturations. PERC positive due to recent dental surgery but low risk wells have low suspicion for PE and patient. EKG was sinus rhythm as reviewed by myself and Dr. Jeneen Rinks. Chest x-ray is unremarkable. Patient was treated with Toradol, GI cocktail, morphine with improvement in her chest pain. I have low suspicion for acute endocarditis given the chest pain occurred shortly after tooth extraction however we'll place patient on clindamycin to cover for infection of molars. Appears to be a dry socket encouraged to follow with her dentist on Monday. CAT scan does note a thyroid nodule that needs an ultrasound. Patient informed and will follow with her primary care doctor for ultrasound. Vital signs remained stable. Patient states that it could be acid reflux and gas pain. Will place on PPI. Patient complains of constipation. Encouraged MiraLAX and stool  softener over-the-counter. Awaiting second troponin. Chest pain is not likely of cardiac or pulmonary etiology d/t presentation, VSS, no tracheal deviation, no JVD or new murmur, RRR, breath sounds equal bilaterally, EKG without acute abnormalities, negative troponin, and negative CXR. Pt has been advised start a PPI and return to the ED is CP becomes exertional, associated with diaphoresis or nausea, radiates to left jaw/arm, worsens or becomes concerning in any way. Likely discharge home with follow-up with PCP. Patient was discussed with Dr. Jeneen Rinks who is agreeable to above plan. Signed out to PA [facility] pending troponin for disposition      Final Clinical Impressions(s) / ED Diagnoses   Final diagnoses:  Nonspecific chest pain  Thyroid nodule    New Prescriptions New Prescriptions   No medications on file   I personally performed the services described in this documentation, which was scribed in my presence. The recorded information has been reviewed and is accurate.     Doristine Devoid, PA-C 11/06/16 [facility]    Tanna Furry, MD 11/19/16 [phone]

## 2016-11-05 NOTE — ED Triage Notes (Signed)
Pt reports having substernal chest pain that has been ongoing for the last 3 days. Pt states a piercing pain in chest worse with deep breath.

## 2016-11-05 NOTE — ED Notes (Signed)
No respiratory or acute distress noted alert and oriented x 3 family at bedside no reaction to medication noted.

## 2016-11-06 LAB — I-STAT TROPONIN, ED: Troponin i, poc: 0 ng/mL (ref 0.00–0.08)

## 2016-11-06 MED ORDER — IOPAMIDOL (ISOVUE-370) INJECTION 76%
INTRAVENOUS | Status: AC
  Filled 2016-11-06: qty 100

## 2016-11-06 MED ORDER — OMEPRAZOLE 20 MG PO CPDR: 20.0000 mg | DELAYED_RELEASE_CAPSULE | Freq: Every day | ORAL | 0 refills | Status: DC

## 2016-11-06 MED ORDER — CLINDAMYCIN HCL 300 MG PO CAPS: 300.0000 mg | ORAL_CAPSULE | Freq: Three times a day (TID) | ORAL | 0 refills | Status: DC

## 2016-11-06 NOTE — ED Notes (Signed)
No respiratory or acute distress noted alert and oriented x 3 no reaction to mediation noted able to speak in full sentences family at bedside.

## 2016-11-06 NOTE — ED Notes (Signed)
Pt ambulate from room to the bathroom and back to room well

## 2016-11-06 NOTE — Discharge Instructions (Signed)
Please take all of your antibiotics until finished! Please call your dentist and regular physician on Monday morning to schedule follow up appointment.  Return to ER for new or worsening symptoms, any additional concerns.

## 2016-11-06 NOTE — ED Provider Notes (Signed)
Care assumed from previous provider PA [place]. Please see note for further details. Case discussed, plan agreed upon. Will follow up on 2nd troponin. If negative and able to ambulate without desat, safe for discharge home with PCP follow up.   2nd troponin negative. Ambulatory with no complaints or desat. Discussed results with patient who feels comfortable with plan and understands return precautions. All questions answered.     [facility] [place], PA-C 11/06/16 8003    Everlene Balls, MD 11/06/16 [phone]

## 2016-11-10 ENCOUNTER — Telehealth: Payer: Self-pay | Admitting: Gastroenterology

## 2016-11-10 NOTE — Telephone Encounter (Signed)
Left message for patient to call back  

## 2016-11-11 ENCOUNTER — Telehealth: Payer: Self-pay | Admitting: Gastroenterology

## 2016-11-14 NOTE — Telephone Encounter (Signed)
Left message for patient to call back  

## 2016-11-14 NOTE — Telephone Encounter (Signed)
See phone note from 11/10/16

## 2016-11-15 NOTE — Telephone Encounter (Signed)
No return call from the patient 

## 2017-11-16 ENCOUNTER — Encounter: Payer: Self-pay | Admitting: Family Medicine

## 2017-11-16 ENCOUNTER — Ambulatory Visit (INDEPENDENT_AMBULATORY_CARE_PROVIDER_SITE_OTHER): Payer: Managed Care, Other (non HMO) | Admitting: Family Medicine

## 2017-11-16 ENCOUNTER — Other Ambulatory Visit: Payer: Self-pay

## 2017-11-16 VITALS — BP 118/81 | HR 90 | Temp 98.2°F | Resp 16 | Ht 66.25 in | Wt 280.2 lb

## 2017-11-16 DIAGNOSIS — D250 Submucous leiomyoma of uterus: Secondary | ICD-10-CM | POA: Diagnosis not present

## 2017-11-16 DIAGNOSIS — E049 Nontoxic goiter, unspecified: Secondary | ICD-10-CM | POA: Diagnosis not present

## 2017-11-16 DIAGNOSIS — Z3042 Encounter for surveillance of injectable contraceptive: Secondary | ICD-10-CM | POA: Diagnosis not present

## 2017-11-16 NOTE — Patient Instructions (Signed)
     IF you received an x-ray today, you will receive an invoice from [facility]. Please contact [facility] at [phone] with questions or concerns regarding your invoice.   IF you received labwork today, you will receive an invoice from [facility]. Please contact [facility] at [phone] with questions or concerns regarding your invoice.   Our billing staff will not be able to assist you with questions regarding bills from these companies.  You will be contacted with the lab results as soon as they are available. The fastest way to get your results is to activate your My Chart account. Instructions are located on the last page of this paperwork. If you have not heard from us regarding the results in 2 weeks, please contact this office.     

## 2017-11-16 NOTE — Progress Notes (Signed)
Chief Complaint  Patient presents with  . New Patient (Initial Visit)    establish care.  Per pt she has allergies and would like medication for it.  Pt wants to discuss weight, and bleeding with depo which she has not ever experienced in the past    HPI   Contraception Pt was previously on nexplanon but could not lose any weight She switched to Depo and lost weight She is not sexually active but stayed on depo because she had such severe pain with periods and wanted to get checked for endometriosis and fibroid She states that she does not get periods until she is due for her next depo  Morbid Obesity She reports that she was diagnosed with IBS and was diagnosed with cholecystectomy She reports that since having her gallbladder out she has been struggling with her weight  She has constipation  But gets continued nausea with eating In the past few months she gained 40 pounds in a year Wt Readings from Last 3 Encounters:  11/16/17 280 lb 3.2 oz (127.1 kg)  11/05/16 255 lb (115.7 kg)  10/19/16 256 lb (116.1 kg)   Body mass index is 44.88 kg/m. She started walking and is now at [employer] She was evaluated and ws told that she had a mass on her thyroid but lost her insurance and was not able to follow up  She had CT angiogram which did not show a PE but showed substernal goiter that has changed in size from 2015 to 2018. She reports weight gain and constipation She reports that she has a past  History of depression  She denies hair loss, skin changes    Past Medical History:  Diagnosis Date  . Allergy   . Anxiety   . Cholecystitis    chronic  . Depression   . GERD (gastroesophageal reflux disease)   . H/O seasonal allergies   . Morbid obesity ([place])   . Sleep apnea    wears CPAP    Current Outpatient Medications  Medication Sig Dispense Refill  . ibuprofen (ADVIL,MOTRIN) 800 MG tablet Take 800 mg by mouth 3 (three) times daily as needed for headache,  mild pain or moderate pain.   1  . MedroxyPROGESTERone Acetate 150 MG/ML SUSY INJECT 1 ML INTO THE MUSCLE Q 90 DAYS  1   Current Facility-Administered Medications  Medication Dose Route Frequency Provider Last Rate Last Dose  . 0.9 %  sodium chloride infusion  500 mL Intravenous Continuous Ladene Artist, MD        Allergies:  Allergies  Allergen Reactions  . Penicillins Other (See Comments)    UNSPECIFIED   . Aspirin Other (See Comments)    "Dizziness."     Past Surgical History:  Procedure Laterality Date  . CESAREAN SECTION    . CHOLECYSTECTOMY N/A 03/15/2016   Procedure: LAPAROSCOPIC CHOLECYSTECTOMY;  Surgeon: Clovis Riley, MD;  Location: [facility];  Service: General;  Laterality: N/A;  . CHOLECYSTECTOMY      Social History   Socioeconomic History  . Marital status: Single    Spouse name: Not on file  . Number of children: 1  . Years of education: Not on file  . Highest education level: Not on file  Occupational History  . Not on file  Social Needs  . Financial resource strain: Not on file  . Food insecurity:    Worry: Not on file    Inability: Not on file  . Transportation needs:  Medical: Not on file    Non-medical: Not on file  Tobacco Use  . Smoking status: Never Smoker  . Smokeless tobacco: Never Used  Substance and Sexual Activity  . Alcohol use: No  . Drug use: No  . Sexual activity: Yes  Lifestyle  . Physical activity:    Days per week: Not on file    Minutes per session: Not on file  . Stress: Not on file  Relationships  . Social connections:    Talks on phone: Not on file    Gets together: Not on file    Attends religious service: Not on file    Active member of club or organization: Not on file    Attends meetings of clubs or organizations: Not on file    Relationship status: Not on file  Other Topics Concern  . Not on file  Social History Narrative  . Not on file    Family History  Problem Relation Age of Onset  . Hypertension  Father   . Diabetes Father   . Heart disease Other   . Hyperlipidemia Maternal Grandfather   . Heart disease Maternal Grandfather      ROS Review of Systems See HPI Constitution: No fevers or chills No malaise No diaphoresis Skin: No rash or itching Eyes: no blurry vision, no double vision GU: no dysuria or hematuria Neuro: no dizziness or headaches  all others reviewed and negative   Objective: Vitals:   11/16/17 1549  BP: 118/81  Pulse: 90  Resp: 16  Temp: 98.2 F (36.8 C)  TempSrc: Oral  SpO2: 97%  Weight: 280 lb 3.2 oz (127.1 kg)  Height: 5' 6.25" (1.683 m)    Physical Exam  Constitutional: She is oriented to person, place, and time. She appears well-developed and well-nourished.  HENT:  Head: Normocephalic and atraumatic.  Eyes: Conjunctivae and EOM are normal.  Neck: Normal range of motion. Neck supple. No thyromegaly present.  Cardiovascular: Normal rate, regular rhythm and normal heart sounds.  No murmur heard. Pulmonary/Chest: Effort normal and breath sounds normal. No stridor. No respiratory distress. She has no wheezes.  Neurological: She is alert and oriented to person, place, and time. She displays normal reflexes.  Skin: Skin is warm. Capillary refill takes less than 2 seconds.  Psychiatric: She has a normal mood and affect. Her behavior is normal. Judgment and thought content normal.    IMPRESSION: 1. No evidence of central pulmonary embolus. Evaluation for pulmonary embolus is suboptimal due to limitations in the timing of the contrast bolus. 2. Lungs clear bilaterally. 3. Thyroid goiter again noted, increased in size from 2015. Thyroid ultrasound is recommended for further evaluation, when and as deemed clinically appropriate.   Electronically Signed   By: Garald Balding M.D.   On: 11/06/2016 00:30  IMPRESSION: 1. Small amount of fluid within the cervical canal, suspected to be related to current menstrual cycle. No focal endometrial  abnormality identified. 2. 0.7 x 0.5 x 0.7 cm echogenic lesion within the uterine body, likely a small calcified fibroid. 3. Otherwise unremarkable pelvic ultrasound.   Electronically Signed   By: Jeannine Boga M.D.   On: 08/19/2016 12:40   Assessment and Plan [name] was seen today for new patient (initial visit).  Diagnoses and all orders for this visit:  Substernal thyroid goiter - will monitor and assess for thyroid -     US Soft Tissue Head/Neck; Future -     TSH + free T4  Submucous leiomyoma of  uterus- discussed referral to Gyne to discuss fibroid and evaluation for endometriosis -     Ambulatory referral to Gynecology  Depot contraception- continue Depo provera  Morbid obesity ([place])- discussed dietary modifications, nutrition and med mgmt     Emily Houston

## 2017-11-17 LAB — TSH+FREE T4
Free T4: 1.19 ng/dL (ref 0.82–1.77)
TSH: 0.774 u[IU]/mL (ref 0.450–4.500)

## 2017-11-22 ENCOUNTER — Ambulatory Visit (INDEPENDENT_AMBULATORY_CARE_PROVIDER_SITE_OTHER): Payer: Managed Care, Other (non HMO) | Admitting: Obstetrics and Gynecology

## 2017-11-22 ENCOUNTER — Other Ambulatory Visit: Payer: Self-pay

## 2017-11-22 ENCOUNTER — Other Ambulatory Visit (HOSPITAL_COMMUNITY)
Admission: RE | Admit: 2017-11-22 | Discharge: 2017-11-22 | Disposition: A | Discharge status: Home / Self Care | Payer: Managed Care, Other (non HMO) | Source: Ambulatory Visit | Attending: Obstetrics and Gynecology | Admitting: Obstetrics and Gynecology

## 2017-11-22 ENCOUNTER — Encounter: Payer: Self-pay | Admitting: Obstetrics and Gynecology

## 2017-11-22 VITALS — BP 102/60 | HR 84 | Resp 16 | Ht 65.5 in | Wt 281.0 lb

## 2017-11-22 DIAGNOSIS — N921 Excessive and frequent menstruation with irregular cycle: Secondary | ICD-10-CM | POA: Diagnosis not present

## 2017-11-22 DIAGNOSIS — N946 Dysmenorrhea, unspecified: Secondary | ICD-10-CM

## 2017-11-22 DIAGNOSIS — Z124 Encounter for screening for malignant neoplasm of cervix: Secondary | ICD-10-CM | POA: Insufficient documentation

## 2017-11-22 DIAGNOSIS — D259 Leiomyoma of uterus, unspecified: Secondary | ICD-10-CM | POA: Diagnosis not present

## 2017-11-22 DIAGNOSIS — R102 Pelvic and perineal pain: Secondary | ICD-10-CM | POA: Diagnosis not present

## 2017-11-22 DIAGNOSIS — N926 Irregular menstruation, unspecified: Secondary | ICD-10-CM

## 2017-11-22 LAB — POCT URINE PREGNANCY: PREG TEST UR: NEGATIVE

## 2017-11-22 NOTE — Progress Notes (Signed)
37 y.o. G1P1001 SingleAfrican AmericanF here referred by Dr Nolon Rod for irregular menstrual cycles and heavy bleeding.   She is on depo-provera, has been for over 2 years. Over the last year she has been getting her depo shots late (secondary to insurance). Depo shot in 11/18 and then not again until March. She had a cycle in the end of 2/18 and it lasted 4 weeks. Over the last month she has been bleeding off and on for a few days at a time.  Typically she gets lower abdominal cramping with the bleeding, can be severe. Still has intermittent lower abdominal cramping without her cycle. At the most she gets a couple of days without pain. Pain ranges from a 7-12/10 in severity. Hard to work with the pain. Takes ibuprofen, helps a little for a few hours.  Has tried to have sex since February because of deep dyspareunia and it often starts the bleeding. Recently broke up with her partner of 5 years (daughter's dad).  Period Duration (Days): 4-28 days  Period Pattern: (!) Irregular Menstrual Flow: Heavy Menstrual Control: Maxi pad Menstrual Control Change Freq (Hours): changes sper pad every 2 hours on heavy days  Dysmenorrhea: (!) Severe Dysmenorrhea Symptoms: Cramping  Cycles prior to contraception were regular, very heavy and very painful.   She initially lost weight on the depo-provera, but was having GI issues. Since her gallbladder was removed she has gained weight. She is up 50 lbs in the last year.   Ultrasound report and images from 2/18 was reviewed. 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 On my review of the images, it may deviate the cavity.   She is being evaluated for an enlarging goiter. Normal TFT's.   Patient's last menstrual period was 11/12/2017.          Sexually active: Yes.    The current method of family planning is Depo-Provera injections.    Exercising: Yes.    walking Smoker:  no  Health Maintenance: Pap:  2018 WNL per patient  History of abnormal Pap:  Yes  years ago colposcopy  MMG:  Never Colonoscopy:  Never BMD:   Never TDaP:  Unsure  Gardasil: no    reports that she has never smoked. She has never used smokeless tobacco. She reports that she does not drink alcohol or use drugs. Daughter is almost 4.  Past Medical History:  Diagnosis Date  . Abnormal uterine bleeding   . Allergy   . Anxiety   . Cholecystitis    chronic  . Depression   . Dysmenorrhea   . Fibroid   . GERD (gastroesophageal reflux disease)   . H/O seasonal allergies   . Hormone disorder   . Morbid obesity ([place])   . Sleep apnea    wears CPAP    Past Surgical History:  Procedure Laterality Date  . CESAREAN SECTION    . CHOLECYSTECTOMY N/A 03/15/2016   Procedure: LAPAROSCOPIC CHOLECYSTECTOMY;  Surgeon: Clovis Riley, MD;  Location: [facility];  Service: General;  Laterality: N/A;  . CHOLECYSTECTOMY      Current Outpatient Medications  Medication Sig Dispense Refill  . ibuprofen (ADVIL,MOTRIN) 800 MG tablet Take 800 mg by mouth 3 (three) times daily as needed for headache, mild pain or moderate pain.   1  . MedroxyPROGESTERone Acetate 150 MG/ML SUSY INJECT 1 ML INTO THE MUSCLE Q 90 DAYS  1   Current Facility-Administered Medications  Medication Dose Route Frequency Provider Last Rate Last Dose  . 0.9 %  sodium chloride infusion  500 mL Intravenous Continuous Ladene Artist, MD        Family History  Problem Relation Age of Onset  . Hypertension Father   . Diabetes Father   . Heart disease Other   . Hyperlipidemia Maternal Grandfather   . Heart disease Maternal Grandfather     Review of Systems  Constitutional: Negative.   HENT: Negative.   Eyes: Negative.   Respiratory: Negative.   Cardiovascular: Negative.   Gastrointestinal: Negative.   Endocrine: Negative.   Genitourinary: Positive for menstrual problem.       Dysmenorrhea Heavy menstrual bleeding Irregular cycles   Musculoskeletal: Negative.   Skin: Negative.   Allergic/Immunologic:  Negative.   Neurological: Negative.   Psychiatric/Behavioral: Negative.     Exam:   BP 102/60 (BP Location: Right Arm, Patient Position: Sitting, Cuff Size: Large)   Pulse 84   Resp 16   Ht 5' 5.5" (1.664 m)   Wt 281 lb (127.5 kg)   LMP 11/12/2017   BMI 46.05 kg/m   Weight change: @WEIGHTCHANGE @ Height:   Height: 5' 5.5" (166.4 cm)  Ht Readings from Last 3 Encounters:  11/22/17 5' 5.5" (1.664 m)  11/16/17 5' 6.25" (1.683 m)  11/05/16 5\' 6"  (1.676 m)    General appearance: alert, cooperative and appears stated age Head: Normocephalic, without obvious abnormality, atraumatic Neck: no adenopathy, supple, symmetrical, trachea midline and thyroid symmetrically enlarged Lungs: clear to auscultation bilaterally Cardiovascular: regular rate and rhythm Abdomen: soft, non-tender; non distended,  no masses,  no organomegaly Extremities: extremities normal, atraumatic, no cyanosis or edema Skin: Skin color, texture, turgor normal. No rashes or lesions Lymph nodes: Cervical, supraclavicular, and axillary nodes normal. No abnormal inguinal nodes palpated Neurologic: Grossly normal   Pelvic: External genitalia:  no lesions              Urethra:  normal appearing urethra with no masses, tenderness or lesions              Bartholins and Skenes: normal                 Vagina: normal appearing vagina with normal color and discharge, no lesions              Cervix: no cervical motion tenderness and no lesions               Bimanual Exam:  Uterus:  not appreciably enlarged, +/- tender              Adnexa: no masses, mild bilateral tenderness               Rectovaginal: Confirms               Anus:  normal sphincter tone, no lesions  Pelvic floor: not tender  Bladder: mildly tender  Chaperone was present for exam.  A:  Abnormal uterine bleeding, likely from incorrect use of depo-provera  50 lb weight gain on depo-provera, would recommend she not continue the depo  Pelvic pain,  intermittent for years  Severe dysmenorrhea  Dyspareunia, deep  P:   Pap with hpv  UPT negative  Return for u/s, possible sonohysterogram, possible endometrial biopsy  GC/CT (declines blood tests for STD)  CBC, Ferritin  UPT negative  CC: Dr Nolon Rod Note sent

## 2017-11-23 LAB — CBC
HEMOGLOBIN: 12.9 g/dL (ref 11.1–15.9)
Hematocrit: 39.6 % (ref 34.0–46.6)
MCH: 27 pg (ref 26.6–33.0)
MCHC: 32.6 g/dL (ref 31.5–35.7)
MCV: 83 fL (ref 79–97)
Platelets: 246 10*3/uL (ref 150–379)
RBC: 4.78 x10E6/uL (ref 3.77–5.28)
RDW: 15 % (ref 12.3–15.4)
WBC: 7.5 10*3/uL (ref 3.4–10.8)

## 2017-11-23 LAB — GC/CHLAMYDIA PROBE AMP
Chlamydia trachomatis, NAA: NEGATIVE
Neisseria gonorrhoeae by PCR: NEGATIVE

## 2017-11-23 LAB — FERRITIN: FERRITIN: 29 ng/mL (ref 15–150)

## 2017-11-24 LAB — CYTOLOGY - PAP
DIAGNOSIS: NEGATIVE
HPV (WINDOPATH): NOT DETECTED

## 2017-11-27 ENCOUNTER — Encounter: Payer: Self-pay | Admitting: Family Medicine

## 2017-11-27 ENCOUNTER — Telehealth: Payer: Self-pay | Admitting: Family Medicine

## 2017-11-27 NOTE — Telephone Encounter (Signed)
Called to get pt scheduled for the u/s soft tissue head and neck and [facility] stated that the order needs to be changed to [id number]. Thanks

## 2017-11-28 NOTE — Addendum Note (Signed)
Addended by: Delia Chimes A on: 11/28/2017 05:17 PM   Modules accepted: Orders

## 2017-11-28 NOTE — Telephone Encounter (Signed)
US thyroid ordered. Addendum done. Thank you.

## 2017-11-29 ENCOUNTER — Ambulatory Visit
Admission: RE | Admit: 2017-11-29 | Discharge: 2017-11-29 | Disposition: A | Discharge status: Home / Self Care | Payer: Managed Care, Other (non HMO) | Source: Ambulatory Visit | Attending: Family Medicine | Admitting: Family Medicine

## 2017-11-29 ENCOUNTER — Telehealth: Payer: Self-pay | Admitting: Obstetrics and Gynecology

## 2017-11-29 DIAGNOSIS — E049 Nontoxic goiter, unspecified: Secondary | ICD-10-CM

## 2017-11-29 NOTE — Telephone Encounter (Signed)
Call placed to patient to convey benefits and scheduled recommended ultrasound with possible sonohysterogram and endometrial biopsy

## 2017-11-29 NOTE — Telephone Encounter (Signed)
Patient returned call. Spoke with patient regarding benefit for recommended ultrasound with possible sonohysterogram and endometrial biopsy. Patient understood and agreeable. Patient advises she will call back by the end of the week to schedule.

## 2017-11-30 NOTE — Progress Notes (Signed)
CLINICAL DATA:  Goiter.  EXAM: THYROID ULTRASOUND  TECHNIQUE: Ultrasound examination of the thyroid gland and adjacent soft tissues was performed.  COMPARISON:  None.  FINDINGS: Parenchymal Echotexture: Mildly heterogenous  Isthmus: Borderline enlarged measures 0.9 cm in diameter  Right lobe: Normal in size measuring 5.9 x 2.1 x 2.4 cm  Left lobe: Enlarged measuring 7.2 x 3.8 x 4.5 cm  _________________________________________________________  Estimated total number of nodules >/= 1 cm: 5  Number of spongiform nodules >/=  2 cm not described below (TR1): 0  Number of mixed cystic and solid nodules >/= 1.5 cm not described below (TR2): 0  _________________________________________________________  Nodule # 1:  Location: Isthmus; Mid  Maximum size: 2.8 cm; Other 2 dimensions: 2.5 x 1.5 cm  Composition: solid/almost completely solid (2)  Echogenicity: hypoechoic (2)  Shape: not taller-than-wide (0)  Margins: smooth (0)  Echogenic foci: none (0)  ACR TI-RADS total points: 4.  ACR TI-RADS risk category: TR4 (4-6 points).  ACR TI-RADS recommendations:  **Given size (>/= 1.5 cm) and appearance, fine needle aspiration of this moderately suspicious nodule should be considered based on TI-RADS criteria.  _________________________________________________________  There is a approximately 1.7 x 1.6 x 1.3 cm predominantly cystic nodule (labeled 2) within the mid aspect the right lobe of the thyroid which is not meet imaging criteria to recommend percutaneous sampling or continued dedicated follow-up.  There is an approximately 1.2 x 1.0 x 0.8 cm isoechoic nodule (labeled 3) within the inferior pole of the left lobe of the thyroid which is not meet imaging criteria to recommend percutaneous sampling or continued dedicated follow-up.  _________________________________________________________  Nodule # 4:  Location: Left;  Inferior  Maximum size: 0.8 cm; Other 2 dimensions: 0.8 x 0.7 cm  Composition: cannot determine (2)  Echogenicity: anechoic (0)  Shape: not taller-than-wide (0)  Margins: smooth (0)  Echogenic foci: peripheral calcifications (2)  ACR TI-RADS total points: 4.  ACR TI-RADS risk category: TR4 (4-6 points).  ACR TI-RADS recommendations:  Given size (<0.9 cm) and appearance, this nodule does NOT meet TI-RADS criteria for biopsy or dedicated follow-up.  _________________________________________________________  Nodule # 5:  Location: Left; Mid  Maximum size: 5.1 cm; Other 2 dimensions: 3.2 x 4.7 cm  Composition: solid/almost completely solid (2)  Echogenicity: isoechoic (1)  Shape: not taller-than-wide (0)  Margins: smooth (0)  Echogenic foci: none (0)  ACR TI-RADS total points: 3.  ACR TI-RADS risk category: TR3 (3 points).  ACR TI-RADS recommendations:  **Given size (>/= 2.5 cm) and appearance, fine needle aspiration of this mildly suspicious nodule should be considered based on TI-RADS criteria.  _________________________________________________________  There is an approximately 1.3 x 1.2 x 0.9 cm anechoic cyst (labeled 6) within the superior pole the left lobe of the thyroid which does not meet imaging criteria to recommend percutaneous sampling or continued dedicated follow-up.  IMPRESSION: 1. Findings suggestive of multinodular goiter. 2. Nodule #5 meets imaging criteria to recommend percutaneous sampling as clinically indicated. 3. None of the additional discretely measured thyroid nodules meet imaging criteria to recommend percutaneous sampling or continued dedicated follow-up.  The above is in keeping with the ACR TI-RADS recommendations - J Am Coll Radiol 2017;14:587-595.   Electronically Signed   By: Sandi Mariscal M.D.   On: 11/29/2017 15:23

## 2017-11-30 NOTE — Addendum Note (Signed)
Addended by: Delia Chimes A on: 11/30/2017 10:03 AM   Modules accepted: Orders

## 2017-12-04 NOTE — Telephone Encounter (Signed)
See previous message. Call placed to patient after 4:30 PM today, as requested by patient. Patient did not answer. Left voicemail message requesting a return call on 12/05/17 after 8:00 AM

## 2017-12-04 NOTE — Telephone Encounter (Signed)
Call placed to patient to follow up and schedule recommended ultrasound with possible sonohysterogram and endometrial biopsy. Patient answered the phone but advises she is at work and requested we call her back at 4:30 today

## 2017-12-14 NOTE — Telephone Encounter (Signed)
Forth call placed to patient to follow up and schedule recommended ultrasound. Left voicemail requesting a return call.   cc: Lamont Snowball, RN

## 2017-12-21 NOTE — Telephone Encounter (Signed)
Multiple attempts have been made to contact this patient to schedule recommended ultrasound with possible sonohysterogram and endometrial biopsy. Patient has not returned calls to schedule. Forwarding to nurse Supervisor to review .  Routing to Lamont Snowball, RN

## 2017-12-25 ENCOUNTER — Ambulatory Visit (INDEPENDENT_AMBULATORY_CARE_PROVIDER_SITE_OTHER): Payer: Self-pay

## 2017-12-25 VITALS — BP 120/76 | HR 79 | Wt 283.0 lb

## 2017-12-25 DIAGNOSIS — Z113 Encounter for screening for infections with a predominantly sexual mode of transmission: Secondary | ICD-10-CM

## 2017-12-25 DIAGNOSIS — N898 Other specified noninflammatory disorders of vagina: Secondary | ICD-10-CM

## 2017-12-25 DIAGNOSIS — R102 Pelvic and perineal pain: Secondary | ICD-10-CM

## 2017-12-25 MED ORDER — FLUCONAZOLE 150 MG PO TABS: 150.0000 mg | ORAL_TABLET | Freq: Once | ORAL | 0 refills | Status: AC

## 2017-12-25 NOTE — Patient Instructions (Signed)
Bacterial Vaginosis Bacterial vaginosis is an infection of the vagina. It happens when too many germs (bacteria) grow in the vagina. This infection puts you at risk for infections from sex (STIs). Treating this infection can lower your risk for some STIs. You should also treat this if you are pregnant. It can cause your baby to be born early. Follow these instructions at home: Medicines  Take over-the-counter and prescription medicines only as told by your doctor.  Take or use your antibiotic medicine as told by your doctor. Do not stop taking or using it even if you start to feel better. General instructions  If you your sexual partner is a woman, tell her that you have this infection. She needs to get treatment if she has symptoms. If you have a female partner, he does not need to be treated.  During treatment: ? Avoid sex. ? Do not douche. ? Avoid alcohol as told. ? Avoid breastfeeding as told.  Drink enough fluid to keep your pee (urine) clear or pale yellow.  Keep your vagina and butt (rectum) clean. ? Wash the area with warm water every day. ? Wipe from front to back after you use the toilet.  Keep all follow-up visits as told by your doctor. This is important. Preventing this condition  Do not douche.  Use only warm water to wash around your vagina.  Use protection when you have sex. This includes: ? Latex condoms. ? Dental dams.  Limit how many people you have sex with. It is best to only have sex with the same person (be monogamous).  Get tested for STIs. Have your partner get tested.  Wear underwear that is cotton or lined with cotton.  Avoid tight pants and pantyhose. This is most important in summer.  Do not use any products that have nicotine or tobacco in them. These include cigarettes and e-cigarettes. If you need help quitting, ask your doctor.  Do not use illegal drugs.  Limit how much alcohol you drink. Contact a doctor if:  Your symptoms do not get  better, even after you are treated.  You have more discharge or pain when you pee (urinate).  You have a fever.  You have pain in your belly (abdomen).  You have pain with sex.  Your bleed from your vagina between periods. Summary  This infection happens when too many germs (bacteria) grow in the vagina.  Treating this condition can lower your risk for some infections from sex (STIs).  You should also treat this if you are pregnant. It can cause early (premature) birth.  Do not stop taking or using your antibiotic medicine even if you start to feel better. This information is not intended to replace advice given to you by your health care provider. Make sure you discuss any questions you have with your health care provider. Document Released: 04/12/2008 Document Revised: 03/19/2016 Document Reviewed: 03/19/2016 Elsevier Interactive Patient Education  2017 Elsevier Inc. Vaginal Yeast infection, Adult Vaginal yeast infection is a condition that causes soreness, swelling, and redness (inflammation) of the vagina. It also causes vaginal discharge. This is a common condition. Some women get this infection frequently. What are the causes? This condition is caused by a change in the normal balance of the yeast (candida) and bacteria that live in the vagina. This change causes an overgrowth of yeast, which causes the inflammation. What increases the risk? This condition is more likely to develop in:  Women who take antibiotic medicines.  Women who have   diabetes.  Women who take birth control pills.  Women who are pregnant.  Women who douche often.  Women who have a weak defense (immune) system.  Women who have been taking steroid medicines for a long time.  Women who frequently wear tight clothing.  What are the signs or symptoms? Symptoms of this condition include:  White, thick vaginal discharge.  Swelling, itching, redness, and irritation of the vagina. The lips of the  vagina (vulva) may be affected as well.  Pain or a burning feeling while urinating.  Pain during sex.  How is this diagnosed? This condition is diagnosed with a medical history and physical exam. This will include a pelvic exam. Your health care provider will examine a sample of your vaginal discharge under a microscope. Your health care provider may send this sample for testing to confirm the diagnosis. How is this treated? This condition is treated with medicine. Medicines may be over-the-counter or prescription. You may be told to use one or more of the following:  Medicine that is taken orally.  Medicine that is applied as a cream.  Medicine that is inserted directly into the vagina (suppository).  Follow these instructions at home:  Take or apply over-the-counter and prescription medicines only as told by your health care provider.  Do not have sex until your health care provider has approved. Tell your sex partner that you have a yeast infection. That person should go to his or her health care provider if he or she develops symptoms.  Do not wear tight clothes, such as pantyhose or tight pants.  Avoid using tampons until your health care provider approves.  Eat more yogurt. This may help to keep your yeast infection from returning.  Try taking a sitz bath to help with discomfort. This is a warm water bath that is taken while you are sitting down. The water should only come up to your hips and should cover your buttocks. Do this 3-4 times per day or as told by your health care provider.  Do not douche.  Wear breathable, cotton underwear.  If you have diabetes, keep your blood sugar levels under control. Contact a health care provider if:  You have a fever.  Your symptoms go away and then return.  Your symptoms do not get better with treatment.  Your symptoms get worse.  You have new symptoms.  You develop blisters in or around your vagina.  You have blood coming  from your vagina and it is not your menstrual period.  You develop pain in your abdomen. This information is not intended to replace advice given to you by your health care provider. Make sure you discuss any questions you have with your health care provider. Document Released: 04/13/2005 Document Revised: 12/16/2015 Document Reviewed: 01/05/2015 Elsevier Interactive Patient Education  2018 Elsevier Inc.  

## 2017-12-25 NOTE — Progress Notes (Signed)
History:  [name] is a 37 y.o. G1P1001 who presents to clinic today for vaginal discharge and burning. She states the symptoms have been ongoing for 2 weeks. She tried monistat with no relief and it caused more burning so she stopped taking it. She feels like she has a yeast infection or BV.   The following portions of the patient's history were reviewed and updated as appropriate: allergies, current medications, family history, past medical history, social history, past surgical history and problem list.  Review of Systems:  Review of Systems  Constitutional: Negative.  Negative for chills and fever.  Respiratory: Negative.   Cardiovascular: Negative.  Negative for chest pain.  Genitourinary:       Vaginal discharge and burning  Neurological: Negative.  Negative for dizziness and headaches.      Objective:  Physical Exam BP 120/76   Pulse 79   Wt 283 lb (128.4 kg)   BMI 46.38 kg/m  Physical Exam  Constitutional: She is oriented to person, place, and time. She appears well-developed and well-nourished. No distress.  HENT:  Head: Normocephalic.  Eyes: Pupils are equal, round, and reactive to light.  Neck: Normal range of motion.  Cardiovascular: Normal rate and regular rhythm.  Pulmonary/Chest: Effort normal and breath sounds normal. No respiratory distress.  Abdominal: Soft. There is no tenderness.  Genitourinary: Vaginal discharge (Small amount yellow discharge) found.  Genitourinary Comments: Pelvic exam: Cervix pink, visually closed, without lesion, diffuse erythema on vaginal walls and external genitalia normal Bimanual exam: Cervix 0/long/high, firm, anterior, neg CMT, uterus nontender, nonenlarged, adnexa without tenderness, enlargement, or mass   Musculoskeletal: Normal range of motion.  Neurological: She is alert and oriented to person, place, and time.  Skin: Skin is warm and dry.  Psychiatric: She has a normal mood and affect. Her behavior is normal.  Judgment and thought content normal.  Nursing note and vitals reviewed.   Assessment & Plan:  1. Vaginal discharge - Cervicovaginal swab done today. -Will prophylactically treat for yeast  2. Vaginal pain  Wende Mott, [place] 12/25/2017 5:55 PM

## 2017-12-28 ENCOUNTER — Telehealth: Payer: Self-pay | Admitting: General Practice

## 2017-12-28 ENCOUNTER — Telehealth: Payer: Self-pay

## 2017-12-28 ENCOUNTER — Telehealth: Payer: Self-pay | Admitting: Family Medicine

## 2017-12-28 DIAGNOSIS — A599 Trichomoniasis, unspecified: Secondary | ICD-10-CM

## 2017-12-28 LAB — CERVICOVAGINAL ANCILLARY ONLY
Bacterial vaginitis: NEGATIVE
Candida vaginitis: NEGATIVE
Chlamydia: NEGATIVE
NEISSERIA GONORRHEA: NEGATIVE
Trichomonas: POSITIVE — AB

## 2017-12-28 MED ORDER — METRONIDAZOLE 500 MG PO TABS: 2000.0000 mg | ORAL_TABLET | Freq: Once | ORAL | 0 refills | Status: AC

## 2017-12-28 NOTE — Telephone Encounter (Signed)
Patient called and left message on nurse voicemail line requesting results. Called back and informed her results are not back yet but we will contact her via mychart or by phone when they do. Patient verbalized understanding & had no questions.

## 2017-12-28 NOTE — Telephone Encounter (Signed)
Attempted to call patient with results. Left VM requesting call back. RX sent to patient's pharmacy.  Wende Mott, CNM 12/28/17 11:19 AM

## 2017-12-28 NOTE — Telephone Encounter (Signed)
Patient returned phone call and results reviewed. Patient verbalized understanding and will pick up medication.  Wende Mott, CNM 12/28/17 11:28 AM

## 2017-12-28 NOTE — Telephone Encounter (Signed)
MyChart message sent to pt about rescheduling apt on 6/20 with [facility]

## 2018-01-04 ENCOUNTER — Ambulatory Visit: Payer: Managed Care, Other (non HMO) | Admitting: Family Medicine

## 2018-01-04 NOTE — Telephone Encounter (Signed)
Follow-up call to patient. Per ROI, can leave message on cell number which gas number confirmation.  Noted in EPIC that patient has seen another Government social research officer on 12-25-17.  Call to patient. Left detailed message calling to follow-up on recommended ultrasound procedure. She has not responded to our calls and we are calling to assist with scheduling or see if she has transferred care. Request call back to [facility], clinical supervisor, [facility].

## 2018-01-09 ENCOUNTER — Emergency Department (HOSPITAL_COMMUNITY)
Admission: EM | Admit: 2018-01-09 | Discharge: 2018-01-10 | Disposition: A | Discharge status: Home / Self Care | Payer: Self-pay | Attending: Emergency Medicine | Admitting: Emergency Medicine

## 2018-01-09 ENCOUNTER — Encounter (HOSPITAL_COMMUNITY): Payer: Self-pay | Admitting: Emergency Medicine

## 2018-01-09 ENCOUNTER — Emergency Department (HOSPITAL_COMMUNITY): Payer: Self-pay

## 2018-01-09 DIAGNOSIS — R0789 Other chest pain: Secondary | ICD-10-CM | POA: Insufficient documentation

## 2018-01-09 HISTORY — DX: Disorder of thyroid, unspecified: E07.9

## 2018-01-09 LAB — CBC
HCT: 40 % (ref 36.0–46.0)
HEMOGLOBIN: 13.1 g/dL (ref 12.0–15.0)
MCH: 27.3 pg (ref 26.0–34.0)
MCHC: 32.8 g/dL (ref 30.0–36.0)
MCV: 83.3 fL (ref 78.0–100.0)
PLATELETS: 241 10*3/uL (ref 150–400)
RBC: 4.8 MIL/uL (ref 3.87–5.11)
RDW: 14.7 % (ref 11.5–15.5)
WBC: 9.2 10*3/uL (ref 4.0–10.5)

## 2018-01-09 LAB — BASIC METABOLIC PANEL
ANION GAP: 5 (ref 5–15)
BUN: 13 mg/dL (ref 6–20)
CALCIUM: 9 mg/dL (ref 8.9–10.3)
CO2: 26 mmol/L (ref 22–32)
CREATININE: 0.76 mg/dL (ref 0.44–1.00)
Chloride: 108 mmol/L (ref 98–111)
GFR calc Af Amer: >60 mL/min (ref >60)
Glucose, Bld: 87 mg/dL (ref 70–99)
Potassium: 3.8 mmol/L (ref 3.5–5.1)
SODIUM: 139 mmol/L (ref 135–145)

## 2018-01-09 LAB — I-STAT TROPONIN, ED: TROPONIN I, POC: 0 ng/mL (ref 0.00–0.08)

## 2018-01-09 LAB — I-STAT BETA HCG BLOOD, ED (MC, WL, AP ONLY)

## 2018-01-09 LAB — D-DIMER, QUANTITATIVE (NOT AT ARMC): D DIMER QUANT: 0.45 ug{FEU}/mL (ref 0.00–0.50)

## 2018-01-09 MED ORDER — ACETAMINOPHEN 500 MG PO TABS
1000.0000 mg | ORAL_TABLET | Freq: Once | ORAL | Status: AC
  Administered 2018-01-09: 1000 mg via ORAL
  Filled 2018-01-09: qty 2

## 2018-01-09 MED ORDER — GI COCKTAIL ~~LOC~~
30.0000 mL | Freq: Once | ORAL | Status: AC
  Administered 2018-01-09: 30 mL via ORAL
  Filled 2018-01-09: qty 30

## 2018-01-09 MED ORDER — METHOCARBAMOL 500 MG PO TABS
1000.0000 mg | ORAL_TABLET | Freq: Once | ORAL | Status: AC
  Administered 2018-01-09: 1000 mg via ORAL
  Filled 2018-01-09: qty 2

## 2018-01-09 NOTE — ED Provider Notes (Signed)
[facility] DEPT Provider Note   CSN: [id number] Arrival date & time: 01/09/18  2152     History   Chief Complaint Chief Complaint  Patient presents with  . Chest Pain    HPI Emily Houston is a 37 y.o. female.  The history is provided by the patient.  Chest Pain   This is a new problem. The current episode started yesterday. The problem occurs constantly. The problem has not changed since onset.The pain is associated with movement. The pain is present in the substernal region. The pain is moderate. Quality: achy. The pain does not radiate. Duration of episode(s) is 20 hours. The symptoms are aggravated by certain positions. Pertinent negatives include no abdominal pain, no back pain, no claudication, no cough, no diaphoresis, no dizziness, no exertional chest pressure, no fever, no headaches, no hemoptysis, no irregular heartbeat, no leg pain, no lower extremity edema, no malaise/fatigue, no nausea, no near-syncope, no numbness, no orthopnea, no palpitations, no PND, no shortness of breath, no sputum production, no syncope, no vomiting and no weakness. She has tried nothing for the symptoms. The treatment provided no relief. Risk factors include obesity.  Pertinent negatives for past medical history include no aneurysm and no Marfan's syndrome.  Pertinent negatives for family medical history include: no Marfan's syndrome.  Procedure history is negative for cardiac catheterization.  Pain since awakening without abatement.  Hurts more twisting from side to side.  No leg swelling.  No OCP.  No long car trips or plane trips.  No exertional symptoms.    Past Medical History:  Diagnosis Date  . Abnormal uterine bleeding   . Allergy   . Anxiety   . Cholecystitis    chronic  . Depression   . Dysmenorrhea   . Fibroid   . GERD (gastroesophageal reflux disease)   . H/O seasonal allergies   . Hormone disorder   . Morbid obesity ([place])   . Sleep apnea      wears CPAP  . Thyroid disease     There are no active problems to display for this patient.   Past Surgical History:  Procedure Laterality Date  . CESAREAN SECTION    . CHOLECYSTECTOMY N/A 03/15/2016   Procedure: LAPAROSCOPIC CHOLECYSTECTOMY;  Surgeon: Clovis Riley, MD;  Location: [facility];  Service: General;  Laterality: N/A;  . CHOLECYSTECTOMY       OB History    Gravida  1   Para  1   Term  1   Preterm      AB      Living  1     SAB      TAB      Ectopic      Multiple      Live Births  1            Home Medications    Prior to Admission medications   Medication Sig Start Date End Date Taking? Authorizing Provider  ibuprofen (ADVIL,MOTRIN) 800 MG tablet Take 800 mg by mouth 3 (three) times daily as needed for headache, mild pain or moderate pain.  08/10/16  Yes [provider]    Family History Family History  Problem Relation Age of Onset  . Hypertension Father   . Diabetes Father   . Heart disease Other   . Hyperlipidemia Maternal Grandfather   . Heart disease Maternal Grandfather     Social History Social History   Tobacco Use  . Smoking status: Never Smoker  .  Smokeless tobacco: Never Used  Substance Use Topics  . Alcohol use: No  . Drug use: No     Allergies   Penicillins and Aspirin   Review of Systems Review of Systems  Constitutional: Negative for diaphoresis, fever and malaise/fatigue.  HENT: Negative for trouble swallowing.   Respiratory: Negative for cough, hemoptysis, sputum production, chest tightness and shortness of breath.   Cardiovascular: Positive for chest pain. Negative for palpitations, orthopnea, claudication, leg swelling, syncope, PND and near-syncope.  Gastrointestinal: Negative for abdominal pain, nausea and vomiting.  Musculoskeletal: Negative for back pain and neck pain.  Neurological: Negative for dizziness, weakness, numbness and headaches.  All other systems reviewed and are  negative.    Physical Exam Updated Vital Signs BP 118/73 (BP Location: Left Arm)   Pulse 82   Temp 98.6 F (37 C) (Oral)   Resp 18   SpO2 100%   Physical Exam  Constitutional: She is oriented to person, place, and time. She appears well-developed and well-nourished. No distress.  HENT:  Head: Normocephalic and atraumatic.  Mouth/Throat: No oropharyngeal exudate.  Eyes: Pupils are equal, round, and reactive to light. Conjunctivae are normal.  Neck: Normal range of motion. Neck supple.  Cardiovascular: Normal rate, regular rhythm, normal heart sounds and intact distal pulses.  Pulmonary/Chest: Effort normal and breath sounds normal. No stridor. She has no wheezes. She has no rales.  Abdominal: Soft. Bowel sounds are normal. She exhibits no mass. There is no tenderness. There is no rebound and no guarding.  Musculoskeletal: Normal range of motion. She exhibits no edema or tenderness.  Neurological: She is alert and oriented to person, place, and time.  Skin: Skin is warm and dry. Capillary refill takes less than 2 seconds.  Psychiatric: She has a normal mood and affect.  Nursing note and vitals reviewed.    ED Treatments / Results  Labs (all labs ordered are listed, but only abnormal results are displayed) Labs Reviewed  BASIC METABOLIC PANEL  CBC  D-DIMER, QUANTITATIVE (NOT AT [facility])  I-STAT TROPONIN, ED  I-STAT BETA HCG BLOOD, ED ([facility], [facility], [facility] ONLY)    EKG EKG Interpretation  Date/Time:  Tuesday January 09 2018 22:06:29 EDT Ventricular Rate:  77 PR Interval:    QRS Duration: 87 QT Interval:  348 QTC Calculation: 394 R Axis:   61 Text Interpretation:  Sinus rhythm Confirmed by Dory Horn) on 01/09/2018 11:02:26 PM   Radiology Dg Chest 2 View  Result Date: 01/09/2018 CLINICAL DATA:  Chest pain EXAM: CHEST - 2 VIEW COMPARISON:  11/05/2016 chest radiograph. FINDINGS: Stable cardiomediastinal silhouette with normal heart size. No pneumothorax. No pleural  effusion. Lungs appear clear, with no acute consolidative airspace disease and no pulmonary edema. IMPRESSION: No active cardiopulmonary disease. Electronically Signed   By: Ilona Sorrel M.D.   On: 01/09/2018 22:30    Procedures Procedures (including critical care time)  Medications Ordered in ED Medications  gi cocktail (Maalox,Lidocaine,Donnatal) (30 mLs Oral Given 01/09/18 2333)  acetaminophen (TYLENOL) tablet 1,000 mg (1,000 mg Oral Given 01/09/18 2332)  methocarbamol (ROBAXIN) tablet 1,000 mg (1,000 mg Oral Given 01/09/18 2332)    Very low risk for PE with negative DDimer.  HEART score is one, very low risk for MACE.  Ruled out in the ED.  Pain is likely MSK in nature.    Final Clinical Impressions(s) / ED Diagnoses  Follow up with your PMD for ongoing care.  Will start muscle relaxers.  Also wamr compresses.    Return  for weakness, numbness, changes in vision or speech, fevers >100.4 unrelieved by medication, shortness of breath, intractable vomiting, or diarrhea, abdominal pain, Inability to tolerate liquids or food, cough, altered mental status or any concerns. No signs of systemic illness or infection. The patient is nontoxic-appearing on exam and vital signs are within normal limits.   I have reviewed the triage vital signs and the nursing notes. Pertinent labs &imaging results that were available during my care of the patient were reviewed by me and considered in my medical decision making (see chart for details).  After history, exam, and medical workup I feel the patient has been appropriately medically screened and is safe for discharge home. Pertinent diagnoses were discussed with the patient. Patient was given return precautions.    Mafalda Mcginniss, MD 01/10/18 [id number]

## 2018-01-09 NOTE — Telephone Encounter (Signed)
No response from patient despite multiple attempts. Notes in Epic indicate she has seen another OB-GYN on 12-25-17.   Please advise if any additional follow-up needed.

## 2018-01-09 NOTE — ED Triage Notes (Signed)
Patient c/o constant sharp central chest pains with SOB today. Reports pain worsens with movement. Denies N/V, pain radiation.

## 2018-01-10 LAB — I-STAT TROPONIN, ED: Troponin i, poc: 0 ng/mL (ref 0.00–0.08)

## 2018-01-10 MED ORDER — METHOCARBAMOL 500 MG PO TABS: 500.0000 mg | ORAL_TABLET | Freq: Two times a day (BID) | ORAL | 0 refills | Status: DC

## 2018-01-25 NOTE — Telephone Encounter (Signed)
Will close encounter

## 2018-02-19 IMAGING — CT CT ANGIO CHEST
2 of 7 series · 18 of 46 positions shown · IV contrast (isovue)
Comparison: Chest radiograph performed 11/05/2016, and CTA of the
chest performed 03/24/2014

CLINICAL DATA: Acute onset of central chest pain and shortness of
breath. Initial encounter.

EXAM:
CT ANGIOGRAPHY CHEST WITH CONTRAST
TECHNIQUE: Multidetector CT imaging of the chest was performed using the
standard protocol during bolus administration of intravenous
contrast. Multiplanar CT image reconstructions and MIPs were
obtained to evaluate the vascular anatomy.
CONTRAST:  100 mL of Isovue 370 IV contrast

[Series 7: thins for pacs · axial · 0.71mm/px · z∈[-314,-77]mm · 15 of 261 slices shown]
[im 12/261  lung]
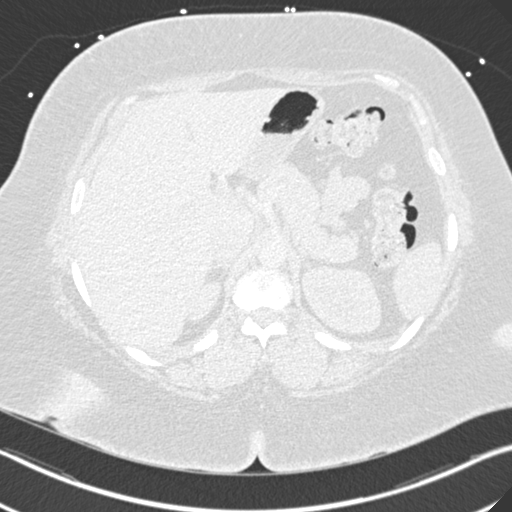
[im 36/261  soft-tissue]
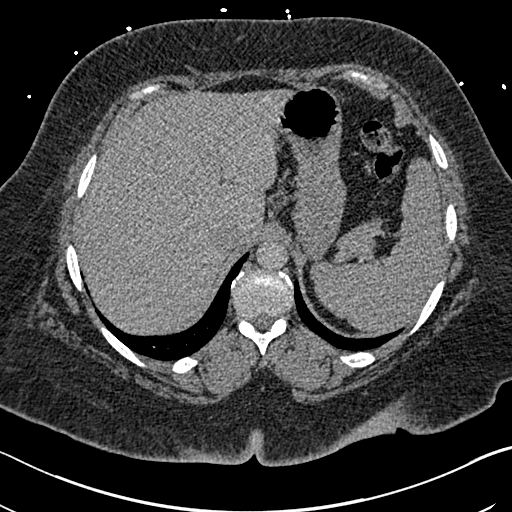
[im 48/261  lung]
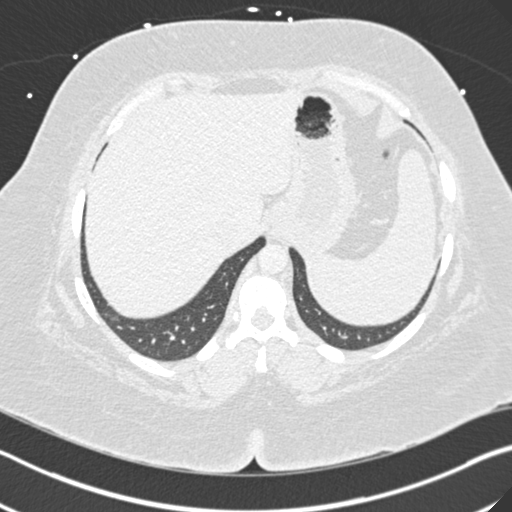
[im 60/261  soft-tissue]
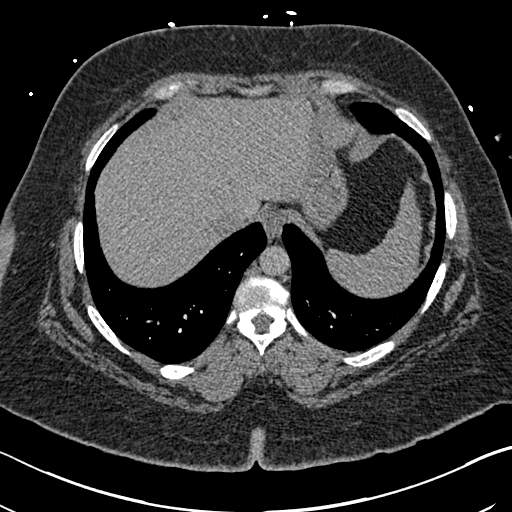
[im 83/261  lung]
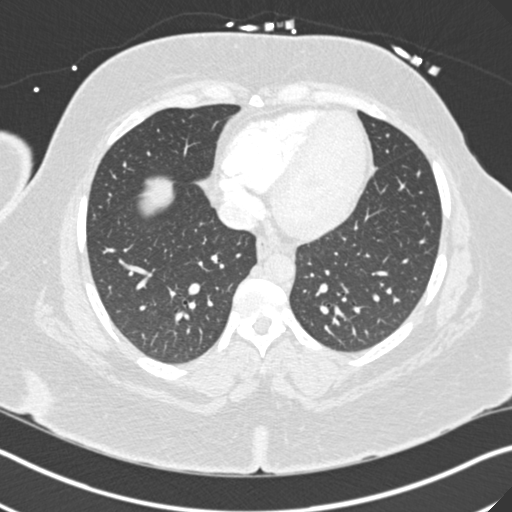
[im 95/261  soft-tissue]
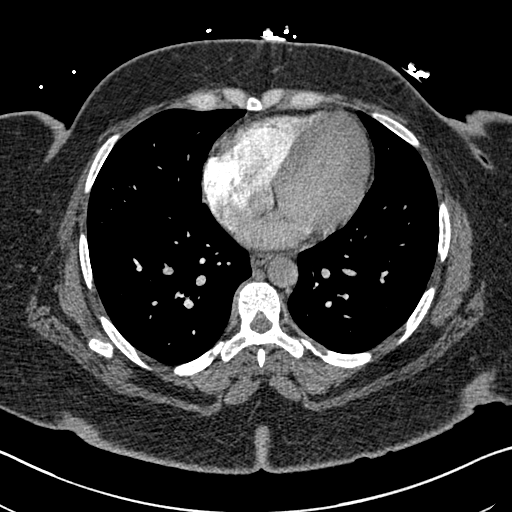
[im 119/261  lung]
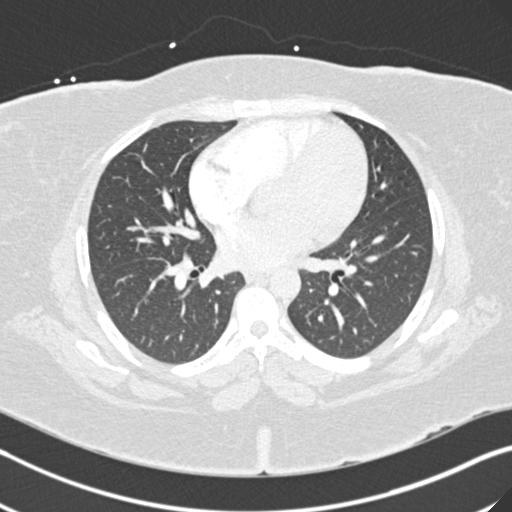
[im 131/261  soft-tissue]
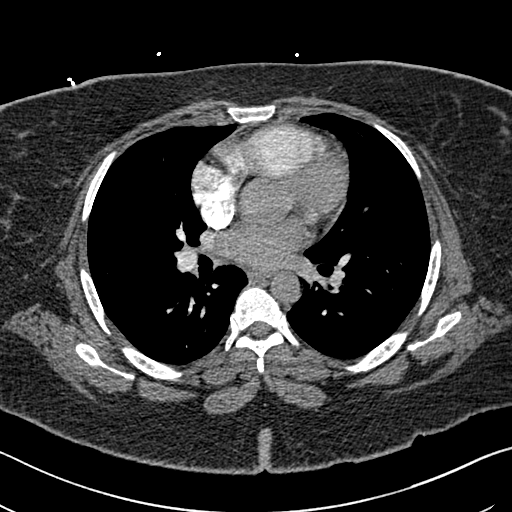
[im 142/261  lung]
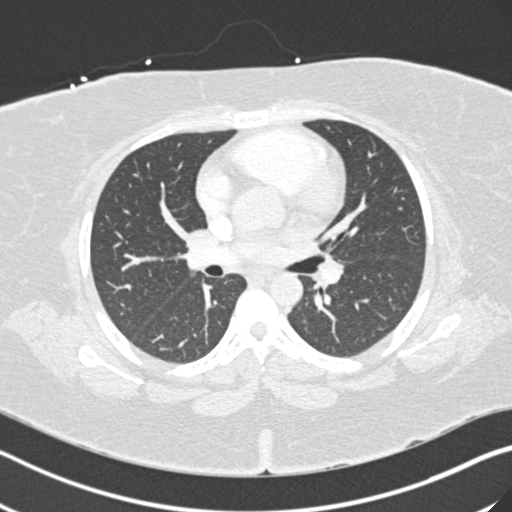
[im 166/261  soft-tissue]
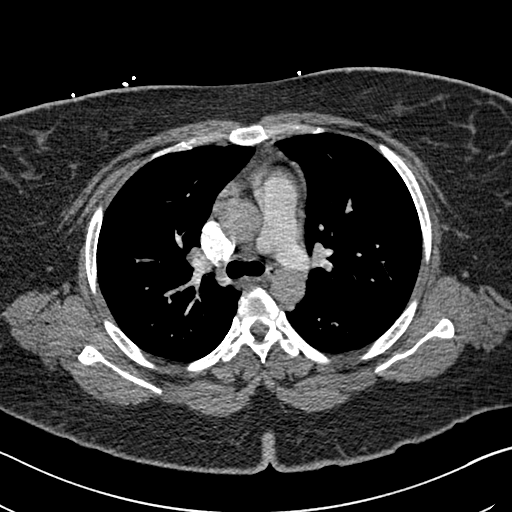
[im 178/261  lung]
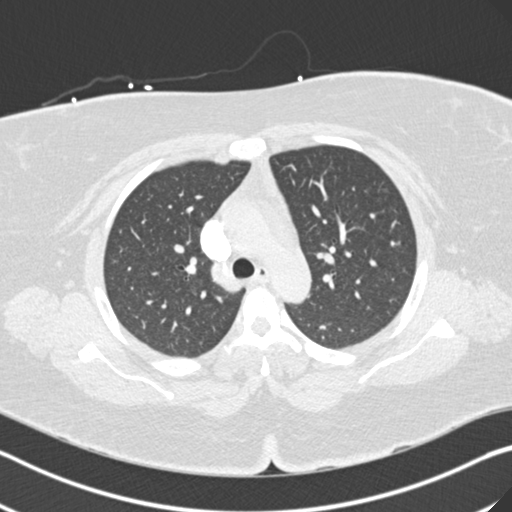
[im 201/261  soft-tissue]
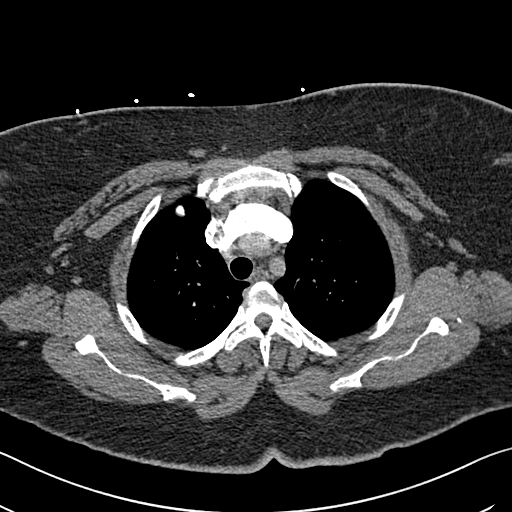
[im 213/261  lung]
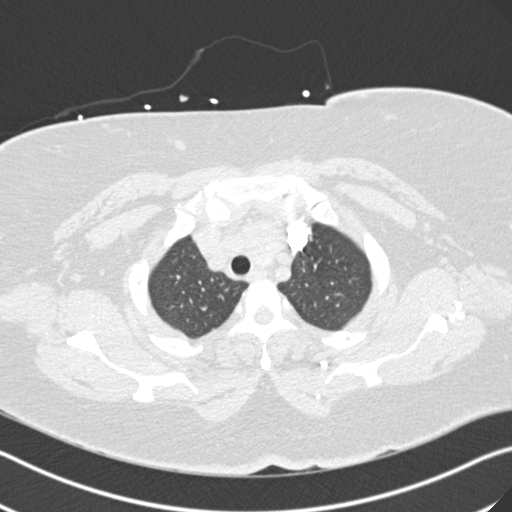
[im 225/261  soft-tissue]
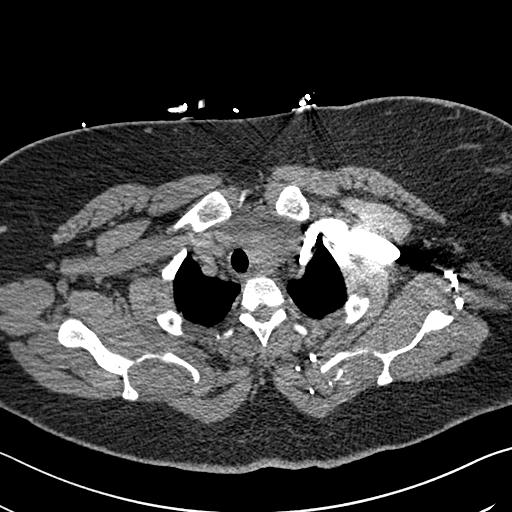
[im 249/261  lung]
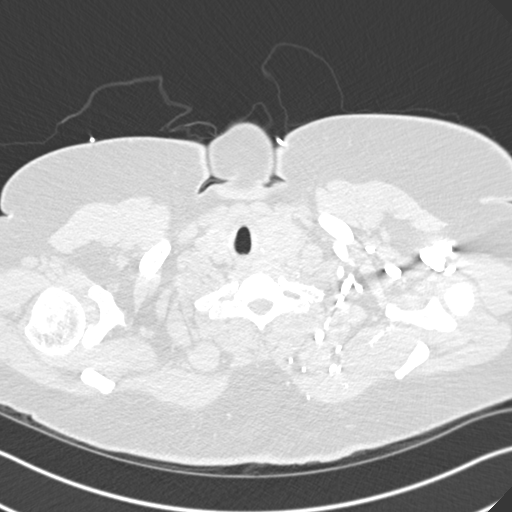

[Series 9: coronal mpr · coronal · 0.51mm/px · 3 of 139 slices shown]
[im 35/139  soft-tissue]
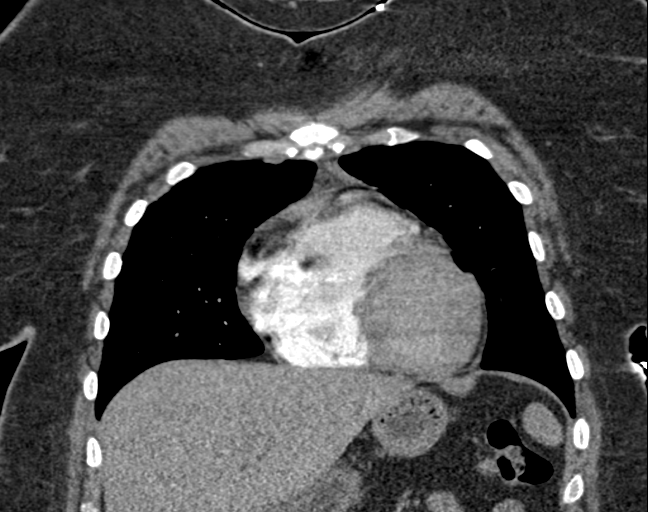
[im 70/139  soft-tissue]
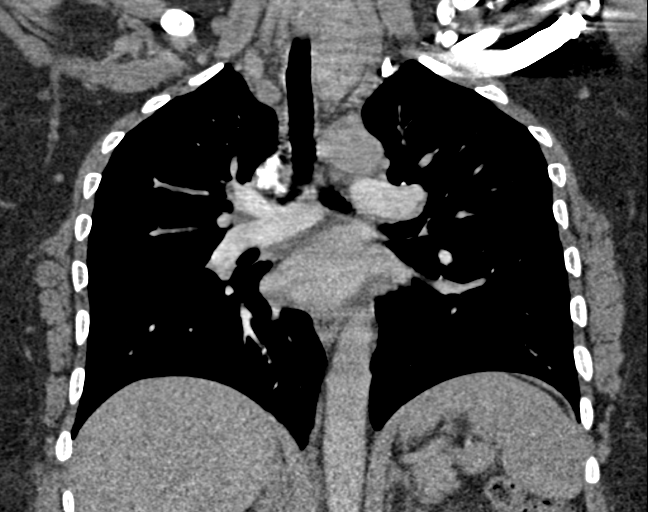
[im 104/139  soft-tissue]
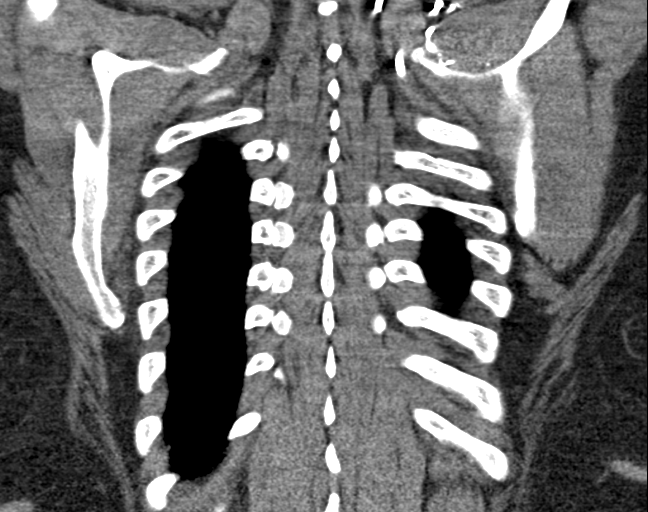

[18 of 46 positions shown; findings below may reference images not displayed]

FINDINGS: Cardiovascular: There is no evidence of central pulmonary embolus.
Evaluation for pulmonary embolus is suboptimal due to limitations in
the timing of the contrast bolus.

The heart is normal in size. The thoracic aorta is grossly
unremarkable. The great vessels are within normal limits.

Mediastinum/Nodes: The mediastinum is unremarkable in appearance. No
mediastinal lymphadenopathy is seen. No pericardial effusion is
identified. There is diffuse enlargement of the thyroid, larger on
the left. This may reflect a thyroid goiter, increased in size from
3291. No axillary lymphadenopathy is seen.

Lungs/Pleura: The lungs are clear bilaterally. No focal
consolidation, pleural effusion or pneumothorax is seen. No masses
are identified.

Upper Abdomen: The visualized portions of the liver and spleen are
grossly unremarkable. The visualized portions of the pancreas,
adrenal glands and kidneys are within normal limits.

Musculoskeletal: No acute osseous abnormalities are identified. The
visualized musculature is unremarkable in appearance.

Review of the MIP images confirms the above findings.
IMPRESSION: 1. No evidence of central pulmonary embolus. Evaluation for
pulmonary embolus is suboptimal due to limitations in the timing of
the contrast bolus.
2. Lungs clear bilaterally.
3. Thyroid goiter again noted, increased in size from 3291. Thyroid
ultrasound is recommended for further evaluation, when and as deemed
clinically appropriate.

## 2018-02-26 ENCOUNTER — Ambulatory Visit (INDEPENDENT_AMBULATORY_CARE_PROVIDER_SITE_OTHER): Payer: Self-pay | Admitting: Otolaryngology

## 2018-02-26 DIAGNOSIS — D440 Neoplasm of uncertain behavior of thyroid gland: Secondary | ICD-10-CM

## 2018-02-27 ENCOUNTER — Telehealth: Payer: Self-pay | Admitting: Family Medicine

## 2018-02-27 ENCOUNTER — Encounter: Payer: Self-pay | Admitting: Family Medicine

## 2018-02-27 NOTE — Telephone Encounter (Signed)
Copied from [facility] [id number]. Topic: Inquiry >> Feb 26, 2018  4:41 PM Nils Flack, Marland Kitchen wrote: Reason for CRM: pt would like to have call about thyroid labs. Please call [phone]

## 2018-02-27 NOTE — Telephone Encounter (Signed)
Spoke to the patient and discussed that her thyroid levels are normal. She will be getting a biopsy of the goiter.

## 2018-03-01 ENCOUNTER — Other Ambulatory Visit: Payer: Self-pay | Admitting: Otolaryngology

## 2018-03-01 ENCOUNTER — Other Ambulatory Visit (INDEPENDENT_AMBULATORY_CARE_PROVIDER_SITE_OTHER): Payer: Self-pay | Admitting: Otolaryngology

## 2018-03-01 DIAGNOSIS — E041 Nontoxic single thyroid nodule: Secondary | ICD-10-CM

## 2018-03-08 ENCOUNTER — Ambulatory Visit
Admission: RE | Admit: 2018-03-08 | Discharge: 2018-03-08 | Disposition: A | Discharge status: Home / Self Care | Payer: No Typology Code available for payment source | Source: Ambulatory Visit | Attending: Otolaryngology | Admitting: Otolaryngology

## 2018-03-08 ENCOUNTER — Other Ambulatory Visit (HOSPITAL_COMMUNITY)
Admission: RE | Admit: 2018-03-08 | Discharge: 2018-03-08 | Disposition: A | Discharge status: Home / Self Care | Payer: No Typology Code available for payment source | Source: Ambulatory Visit | Attending: Student | Admitting: Student

## 2018-03-08 DIAGNOSIS — E041 Nontoxic single thyroid nodule: Secondary | ICD-10-CM | POA: Insufficient documentation

## 2018-09-08 ENCOUNTER — Emergency Department (HOSPITAL_COMMUNITY)
Admission: EM | Admit: 2018-09-08 | Discharge: 2018-09-08 | Disposition: A | Discharge status: Home / Self Care | Payer: No Typology Code available for payment source | Attending: Emergency Medicine | Admitting: Emergency Medicine

## 2018-09-08 ENCOUNTER — Encounter (HOSPITAL_COMMUNITY): Payer: Self-pay

## 2018-09-08 ENCOUNTER — Emergency Department (HOSPITAL_COMMUNITY): Payer: No Typology Code available for payment source

## 2018-09-08 ENCOUNTER — Other Ambulatory Visit: Payer: Self-pay

## 2018-09-08 DIAGNOSIS — S93402A Sprain of unspecified ligament of left ankle, initial encounter: Secondary | ICD-10-CM | POA: Insufficient documentation

## 2018-09-08 DIAGNOSIS — Y92410 Unspecified street and highway as the place of occurrence of the external cause: Secondary | ICD-10-CM | POA: Diagnosis not present

## 2018-09-08 DIAGNOSIS — IMO0001 Reserved for inherently not codable concepts without codable children: Secondary | ICD-10-CM

## 2018-09-08 DIAGNOSIS — M62830 Muscle spasm of back: Secondary | ICD-10-CM | POA: Diagnosis not present

## 2018-09-08 DIAGNOSIS — M7918 Myalgia, other site: Secondary | ICD-10-CM | POA: Diagnosis present

## 2018-09-08 DIAGNOSIS — Y999 Unspecified external cause status: Secondary | ICD-10-CM | POA: Insufficient documentation

## 2018-09-08 DIAGNOSIS — Y9389 Activity, other specified: Secondary | ICD-10-CM | POA: Insufficient documentation

## 2018-09-08 MED ORDER — CYCLOBENZAPRINE HCL 10 MG PO TABS
10.0000 mg | ORAL_TABLET | Freq: Once | ORAL | Status: AC
  Administered 2018-09-08: 10 mg via ORAL
  Filled 2018-09-08: qty 1

## 2018-09-08 MED ORDER — CYCLOBENZAPRINE HCL 10 MG PO TABS: 10.0000 mg | ORAL_TABLET | Freq: Two times a day (BID) | ORAL | 0 refills | Status: DC | PRN

## 2018-09-08 MED ORDER — NAPROXEN 500 MG PO TABS: 500.0000 mg | ORAL_TABLET | Freq: Two times a day (BID) | ORAL | 0 refills | Status: DC
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
## 2018-09-08 NOTE — Discharge Instructions (Addendum)
Wear the ankle brace for comfort. Take the medication as directed. Do not take the muscle relaxer if driving as it will make you sleepy. Follow up with your doctor or return here for worsening symptoms.
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
## 2018-09-18 ENCOUNTER — Encounter (HOSPITAL_COMMUNITY): Payer: Self-pay | Admitting: *Deleted

## 2018-09-18 ENCOUNTER — Emergency Department (HOSPITAL_COMMUNITY)
Admission: EM | Admit: 2018-09-18 | Discharge: 2018-09-18 | Disposition: A | Discharge status: Home / Self Care | Payer: Self-pay | Attending: Emergency Medicine | Admitting: Emergency Medicine

## 2018-09-18 ENCOUNTER — Other Ambulatory Visit: Payer: Self-pay

## 2018-09-18 DIAGNOSIS — M25512 Pain in left shoulder: Secondary | ICD-10-CM | POA: Insufficient documentation

## 2018-09-18 MED ORDER — METHOCARBAMOL 500 MG PO TABS: 500.0000 mg | ORAL_TABLET | Freq: Two times a day (BID) | ORAL | 0 refills | Status: DC
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
## 2018-09-18 NOTE — Discharge Instructions (Addendum)
Expect your soreness to increase over the next 2-3 days. Take it easy, but do not lay around too much as this may make any stiffness worse.  Antiinflammatory medications: Take 600 mg of ibuprofen every 6 hours or 440 mg (over the counter dose) to 500 mg (prescription dose) of naproxen every 12 hours for the next 3 days. After this time, these medications may be used as needed for pain. Take these medications with food to avoid upset stomach. Choose only one of these medications, do not take them together. Acetaminophen (generic for Tylenol): Should you continue to have additional pain while taking the ibuprofen or naproxen, you may add in acetaminophen as needed. Your daily total maximum amount of acetaminophen from all sources should be limited to 4000mg /day for persons without liver problems, or 2000mg /day for those with liver problems. Muscle relaxer: Robaxin is a muscle relaxer and may help loosen stiff muscles. Do not take the Robaxin while driving or performing other dangerous activities.  Lidocaine patches: These are available via either prescription or over-the-counter. The over-the-counter option may be more economical one and are likely just as effective. There are multiple over-the-counter brands, such as Salonpas. Exercises: Be sure to perform the attached exercises starting with three times a week and working up to performing them daily. This is an essential part of preventing long term problems.  Follow up: Follow up with a primary care provider for any future management of these complaints. Be sure to follow up within 7-10 days. Return: Return to the ED should symptoms worsen.  For prescription assistance, may try using prescription discount sites or apps, such as goodrx.com
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
## 2019-02-12 ENCOUNTER — Other Ambulatory Visit: Payer: Self-pay

## 2019-02-12 DIAGNOSIS — Z20822 Contact with and (suspected) exposure to covid-19: Secondary | ICD-10-CM

## 2019-02-14 LAB — NOVEL CORONAVIRUS, NAA: SARS-CoV-2, NAA: NOT DETECTED

## 2019-03-13 ENCOUNTER — Emergency Department (HOSPITAL_COMMUNITY): Payer: Self-pay

## 2019-03-13 ENCOUNTER — Encounter (HOSPITAL_COMMUNITY): Payer: Self-pay

## 2019-03-13 ENCOUNTER — Emergency Department (HOSPITAL_COMMUNITY)
Admission: EM | Admit: 2019-03-13 | Discharge: 2019-03-13 | Disposition: A | Discharge status: Home / Self Care | Payer: Self-pay | Attending: Emergency Medicine | Admitting: Emergency Medicine

## 2019-03-13 ENCOUNTER — Other Ambulatory Visit: Payer: Self-pay

## 2019-03-13 DIAGNOSIS — U071 COVID-19: Secondary | ICD-10-CM | POA: Insufficient documentation

## 2019-03-13 DIAGNOSIS — Z79899 Other long term (current) drug therapy: Secondary | ICD-10-CM | POA: Insufficient documentation

## 2019-03-13 LAB — CBC WITH DIFFERENTIAL/PLATELET
Abs Immature Granulocytes: 0.01 10*3/uL (ref 0.00–0.07)
Basophils Absolute: 0 10*3/uL (ref 0.0–0.1)
Basophils Relative: 0 %
Eosinophils Absolute: 0 10*3/uL (ref 0.0–0.5)
Eosinophils Relative: 0 %
HCT: 45.7 % (ref 36.0–46.0)
Hemoglobin: 14.5 g/dL (ref 12.0–15.0)
Immature Granulocytes: 0 %
Lymphocytes Relative: 30 %
Lymphs Abs: 1.6 10*3/uL (ref 0.7–4.0)
MCH: 26.5 pg (ref 26.0–34.0)
MCHC: 31.7 g/dL (ref 30.0–36.0)
MCV: 83.5 fL (ref 80.0–100.0)
Monocytes Absolute: 0.4 10*3/uL (ref 0.1–1.0)
Monocytes Relative: 8 %
Neutro Abs: 3.3 10*3/uL (ref 1.7–7.7)
Neutrophils Relative %: 62 %
Platelets: 226 10*3/uL (ref 150–400)
RBC: 5.47 MIL/uL — ABNORMAL HIGH (ref 3.87–5.11)
RDW: 14.7 % (ref 11.5–15.5)
WBC: 5.4 10*3/uL (ref 4.0–10.5)
nRBC: 0 % (ref 0.0–0.2)

## 2019-03-13 LAB — URINALYSIS, ROUTINE W REFLEX MICROSCOPIC
Bilirubin Urine: NEGATIVE
Glucose, UA: NEGATIVE mg/dL
Hgb urine dipstick: NEGATIVE
Ketones, ur: NEGATIVE mg/dL
Leukocytes,Ua: NEGATIVE
Nitrite: NEGATIVE
Protein, ur: NEGATIVE mg/dL
Specific Gravity, Urine: 1.008 (ref 1.005–1.030)
pH: 6 (ref 5.0–8.0)

## 2019-03-13 LAB — COMPREHENSIVE METABOLIC PANEL
ALT: 27 U/L (ref 0–44)
AST: 41 U/L (ref 15–41)
Albumin: 3.2 g/dL — ABNORMAL LOW (ref 3.5–5.0)
Alkaline Phosphatase: 52 U/L (ref 38–126)
Anion gap: 11 (ref 5–15)
BUN: 6 mg/dL (ref 6–20)
CO2: 23 mmol/L (ref 22–32)
Calcium: 8.5 mg/dL — ABNORMAL LOW (ref 8.9–10.3)
Chloride: 101 mmol/L (ref 98–111)
Creatinine, Ser: 0.81 mg/dL (ref 0.44–1.00)
GFR calc Af Amer: >60 mL/min (ref >60)
GFR calc non Af Amer: >60 mL/min (ref >60)
Glucose, Bld: 109 mg/dL — ABNORMAL HIGH (ref 70–99)
Potassium: 4 mmol/L (ref 3.5–5.1)
Sodium: 135 mmol/L (ref 135–145)
Total Bilirubin: 0.2 mg/dL — ABNORMAL LOW (ref 0.3–1.2)
Total Protein: 7.1 g/dL (ref 6.5–8.1)

## 2019-03-13 LAB — LIPASE, BLOOD: Lipase: 33 U/L (ref 11–51)

## 2019-03-13 LAB — I-STAT BETA HCG BLOOD, ED (MC, WL, AP ONLY): I-stat hCG, quantitative: <5 m[IU]/mL (ref <5)

## 2019-03-13 MED ORDER — AZITHROMYCIN 250 MG PO TABS: 250.0000 mg | ORAL_TABLET | Freq: Every day | ORAL | 0 refills | Status: DC

## 2019-03-13 MED ORDER — SODIUM CHLORIDE 0.9 % IV BOLUS
1000.0000 mL | Freq: Once | INTRAVENOUS | Status: AC
  Administered 2019-03-13: 1000 mL via INTRAVENOUS

## 2019-03-13 NOTE — ED Notes (Signed)
Patient verbalizes understanding of discharge instructions. Opportunity for questioning and answers were provided. Pt discharged from ED. 

## 2019-03-13 NOTE — Discharge Instructions (Addendum)
Follow up with the health department or call your doctor for any questions. Return to the ER for severe symptoms. Quarantine at home to prevent spread of infection.  Take Zithromax as prescribed for possible pneumonia.

## 2019-03-13 NOTE — ED Triage Notes (Signed)
Pt c/o central and left CP, productive cough x 5 days, and lower bilateral abdominal pain x 1 day; pt states she tested positive for Covid this past Sunday; endorses nausea and diarrhea, denies vomiting

## 2019-03-13 NOTE — ED Provider Notes (Signed)
[facility] EMERGENCY DEPARTMENT Provider Note   CSN: [id number] Arrival date & time: 03/13/19  0944     History   Chief Complaint Chief Complaint  Patient presents with  . Chest Pain  . Abdominal Pain    HPI Emily Houston is a 38 y.o. female.     38 year old female with no significant past medical history presents with complaint of cough, chest congestion, chest tightness, abdominal cramping and diarrhea.  Patient states symptoms started on Saturday with cough and congestion after being exposed to family members for a funeral, multiple family members have tested positive for COVID.  Patient went to [facility] on Sunday for her COVID test and was contacted today and told her test was positive. Reports fevers, last fever was 2 days ago. No other complaints or concerns.      Past Medical History:  Diagnosis Date  . Abnormal uterine bleeding   . Allergy   . Anxiety   . Cholecystitis    chronic  . Depression   . Dysmenorrhea   . Fibroid   . GERD (gastroesophageal reflux disease)   . H/O seasonal allergies   . Hormone disorder   . Morbid obesity ([place])   . Sleep apnea    wears CPAP  . Thyroid disease     There are no active problems to display for this patient.   Past Surgical History:  Procedure Laterality Date  . CESAREAN SECTION    . CHOLECYSTECTOMY N/A 03/15/2016   Procedure: LAPAROSCOPIC CHOLECYSTECTOMY;  Surgeon: Clovis Riley, MD;  Location: [facility];  Service: General;  Laterality: N/A;  . CHOLECYSTECTOMY       OB History    Gravida  1   Para  1   Term  1   Preterm      AB      Living  1     SAB      TAB      Ectopic      Multiple      Live Births  1            Home Medications    Prior to Admission medications   Medication Sig Start Date End Date Taking? Authorizing Provider  azithromycin (ZITHROMAX) 250 MG tablet Take 1 tablet (250 mg total) by mouth daily. Take first 2 tablets together, then 1 every day  until finished. 03/13/19   Tacy Learn, PA-C  methocarbamol (ROBAXIN) 500 MG tablet Take 1 tablet (500 mg total) by mouth 2 (two) times daily. 09/18/18   Joy, Shawn C, PA-C  naproxen (NAPROSYN) 500 MG tablet Take 1 tablet (500 mg total) by mouth 2 (two) times daily. 09/08/18   Ashley Murrain, NP    Family History Family History  Problem Relation Age of Onset  . Hypertension Father   . Diabetes Father   . Heart disease Other   . Hyperlipidemia Maternal Grandfather   . Heart disease Maternal Grandfather     Social History Social History   Tobacco Use  . Smoking status: Never Smoker  . Smokeless tobacco: Never Used  Substance Use Topics  . Alcohol use: No  . Drug use: No     Allergies   Penicillins and Aspirin   Review of Systems Review of Systems  Constitutional: Positive for fever.  HENT: Positive for congestion.   Respiratory: Positive for cough and chest tightness. Negative for shortness of breath.   Cardiovascular: Negative for chest pain, palpitations and leg swelling.  Gastrointestinal: Positive for abdominal pain, diarrhea and nausea. Negative for constipation and vomiting.  Genitourinary: Negative for dysuria and frequency.  Musculoskeletal: Negative for arthralgias and myalgias.  Skin: Negative for rash and wound.  Allergic/Immunologic: Negative for immunocompromised state.  Neurological: Negative for dizziness and weakness.  Hematological: Negative for adenopathy.  Psychiatric/Behavioral: Negative for confusion.  All other systems reviewed and are negative.    Physical Exam Updated Vital Signs BP 108/71   Pulse 90   Temp 98.8 F (37.1 C) (Oral)   Resp 19   Ht 5\' 6"  (1.676 m)   Wt 127 kg   SpO2 100%   BMI 45.19 kg/m   Physical Exam Vitals signs and nursing note reviewed.  Constitutional:      General: She is not in acute distress.    Appearance: She is well-developed. She is obese. She is not diaphoretic.  HENT:     Head: Normocephalic and  atraumatic.  Cardiovascular:     Rate and Rhythm: Normal rate and regular rhythm.     Heart sounds: Normal heart sounds.     Comments: HR 98, RRR Pulmonary:     Effort: Pulmonary effort is normal.     Breath sounds: Normal breath sounds. No decreased breath sounds or wheezing.  Chest:     Chest wall: No tenderness.  Abdominal:     Palpations: Abdomen is soft.     Tenderness: There is no abdominal tenderness.  Musculoskeletal:     Right lower leg: No edema.     Left lower leg: No edema.  Skin:    General: Skin is warm and dry.     Findings: No erythema or rash.  Neurological:     Mental Status: She is alert and oriented to person, place, and time.  Psychiatric:        Behavior: Behavior normal.      ED Treatments / Results  Labs (all labs ordered are listed, but only abnormal results are displayed) Labs Reviewed  CBC WITH DIFFERENTIAL/PLATELET - Abnormal; Notable for the following components:      Result Value   RBC 5.47 (*)    All other components within normal limits  COMPREHENSIVE METABOLIC PANEL - Abnormal; Notable for the following components:   Glucose, Bld 109 (*)    Calcium 8.5 (*)    Albumin 3.2 (*)    Total Bilirubin 0.2 (*)    All other components within normal limits  LIPASE, BLOOD  URINALYSIS, ROUTINE W REFLEX MICROSCOPIC  I-STAT BETA HCG BLOOD, ED ([facility], [facility], [facility] ONLY)    EKG EKG Interpretation  Date/Time:  Wednesday March 13 2019 09:55:45 EDT Ventricular Rate:  102 PR Interval:    QRS Duration: 80 QT Interval:  342 QTC Calculation: 446 R Axis:   70 Text Interpretation:  Sinus tachycardia Low voltage, precordial leads Confirmed by Virgel Manifold [phone]) on 03/13/2019 11:07:06 AM   Radiology Dg Chest Port 1 View  Result Date: 03/13/2019 CLINICAL DATA:  Cough, COVID-19 EXAM: PORTABLE CHEST 1 VIEW COMPARISON:  01/09/2018 FINDINGS: The heart size and mediastinal contours are within normal limits. Subtle patchy opacity within the bilateral lung bases.  No pleural effusion or pneumothorax. The visualized skeletal structures are unremarkable. IMPRESSION: Subtle patchy opacities within the bilateral lung bases, which may reflect atelectasis and/or developing infection. Electronically Signed   By: Davina Poke M.D.   On: 03/13/2019 10:35    Procedures Procedures (including critical care time)  Medications Ordered in ED Medications  sodium chloride 0.9 %  bolus 1,000 mL (0 mLs Intravenous Stopped 03/13/19 1159)     Initial Impression / Assessment and Plan / ED Course  I have reviewed the triage vital signs and the nursing notes.  Pertinent labs & imaging results that were available during my care of the patient were reviewed by me and considered in my medical decision making (see chart for details).  Clinical Course as of Mar 12 1201  Wed Mar 13, 2019  1159 38yo female with known COVID positive test on 03/10/2019 presents with chest tightness, cough,  abdominal cramping and diarrhea. On exam, patient is well appearing, vitals are normal, abdomen is soft and non tender. Patient was given IV fluids, mild tachycardia at triage has resolved. CBC, CMP, lipase, UA reassuring, no significant changes, HVG negative. CXR with possible developing infiltrates, will cover possible PNA with Zithromax. Reviewed results with patient, agreeable with dc home at this time, call PCP or health dept with any concerns, return to ER for severe symptoms.  Emily Houston was evaluated in Emergency Department on 03/13/2019 for the symptoms described in the history of present illness. She was evaluated in the context of the global COVID-19 pandemic, which necessitated consideration that the patient might be at risk for infection with the SARS-CoV-2 virus that causes COVID-19. Institutional protocols and algorithms that pertain to the evaluation of patients at risk for COVID-19 are in a state of rapid change based on information released by regulatory bodies including  the CDC and federal and state organizations. These policies and algorithms were followed during the patient's care in the ED.     [LM]    Clinical Course User Index [LM] Tacy Learn, PA-C      Final Clinical Impressions(s) / ED Diagnoses   Final diagnoses:  [id number]    ED Discharge Orders         Ordered    azithromycin (ZITHROMAX) 250 MG tablet  Daily     03/13/19 1158           Roque Lias 03/13/19 1202    Virgel Manifold, MD 03/14/19 1136

## 2019-06-21 IMAGING — US US FNA BIOPSY THYROID 1ST LESION
1 series · 13 of 17 positions shown · non-contrast
Comparison: US THYROID 11/29/2017

MEDICATIONS:
None

COMPLICATIONS:
None immediate.

INDICATION: Indeterminate thyroid nodule the left mid thyroid. Request is made
for fine-needle aspiration of indeterminate thyroid nodule.

EXAM:
ULTRASOUND GUIDED FINE NEEDLE ASPIRATION OF INDETERMINATE THYROID
NODULE
TECHNIQUE: Informed written consent was obtained from the patient after a
discussion of the risks, benefits and alternatives to treatment.
Questions regarding the procedure were encouraged and answered. A
timeout was performed prior to the initiation of the procedure.

[Series 1: us fna biopsy thyroid 1st lesion · 0.09mm/px · 17 acquisitions, 13 frames shown]
[im 1/17]
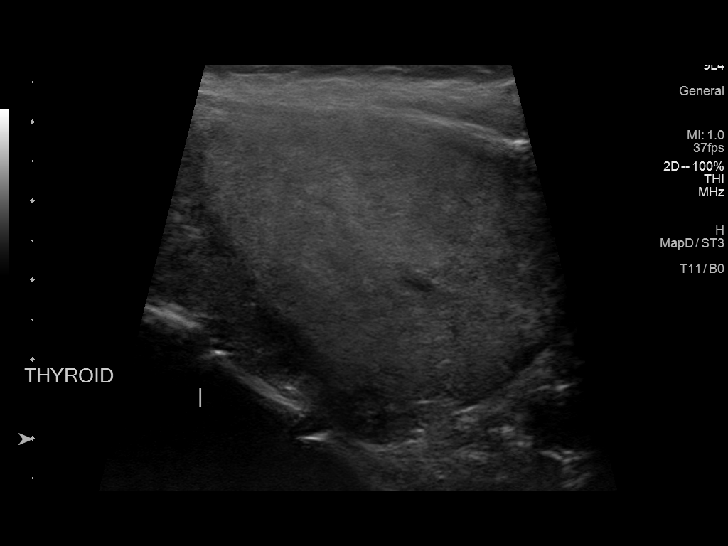
[im 2/17]
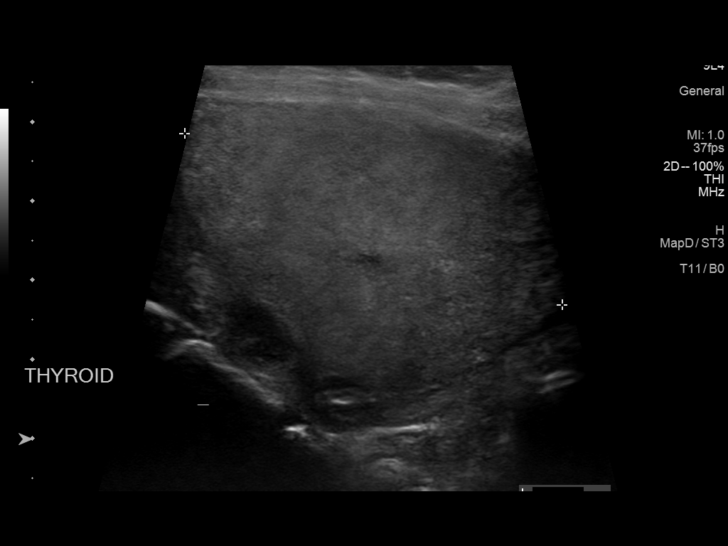
[im 4/17]
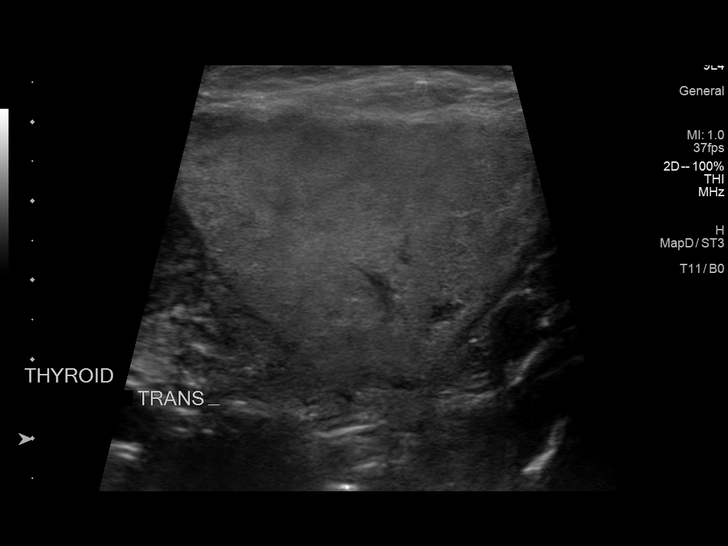
[im 5/17]
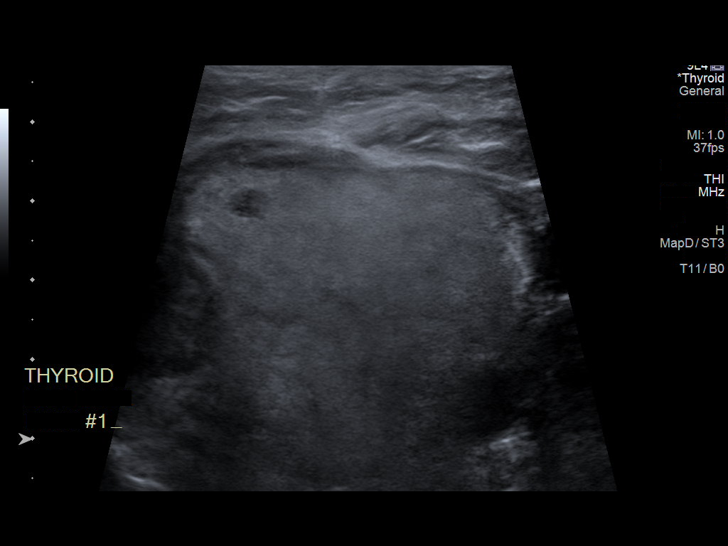
[im 6/17]
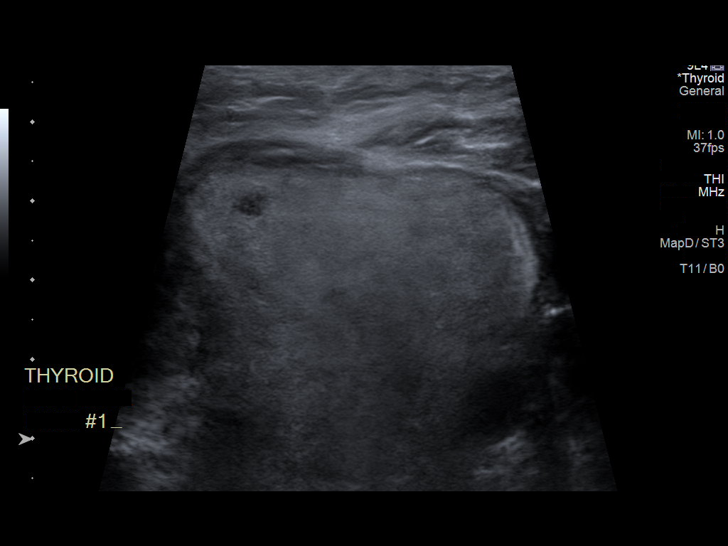
[im 8/17]
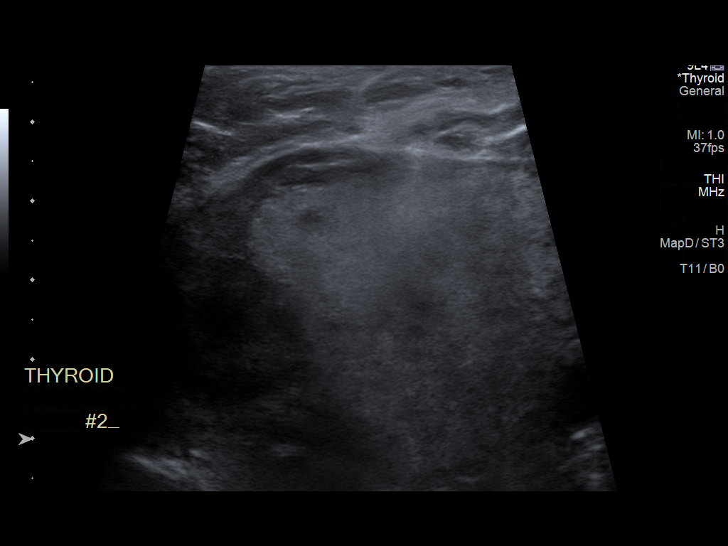
[im 9/17]
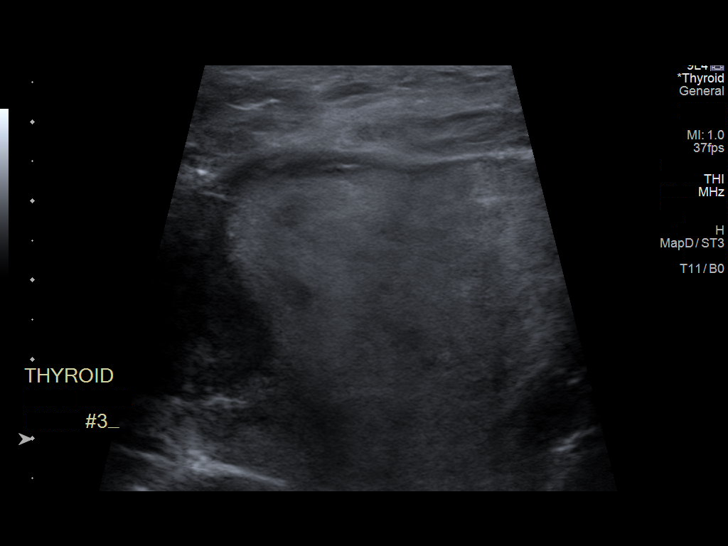
[im 10/17]
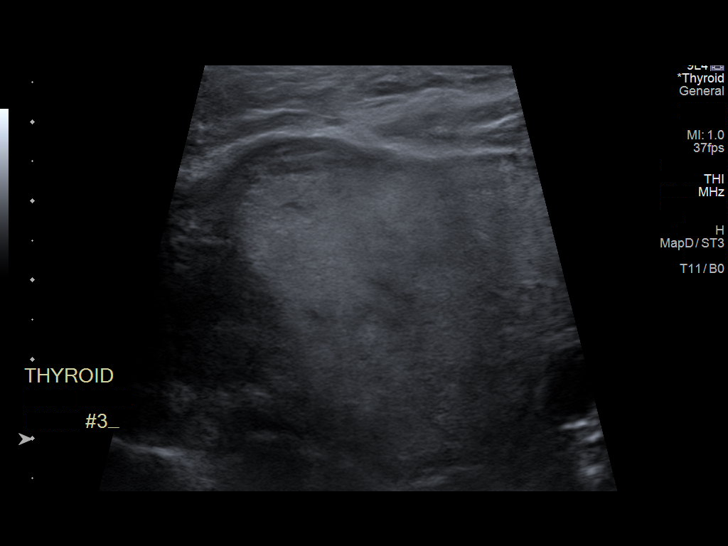
[im 12/17]
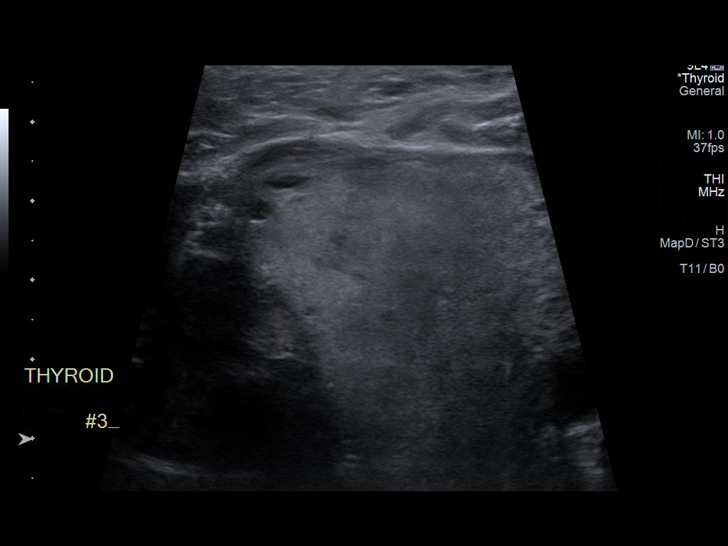
[im 13/17]
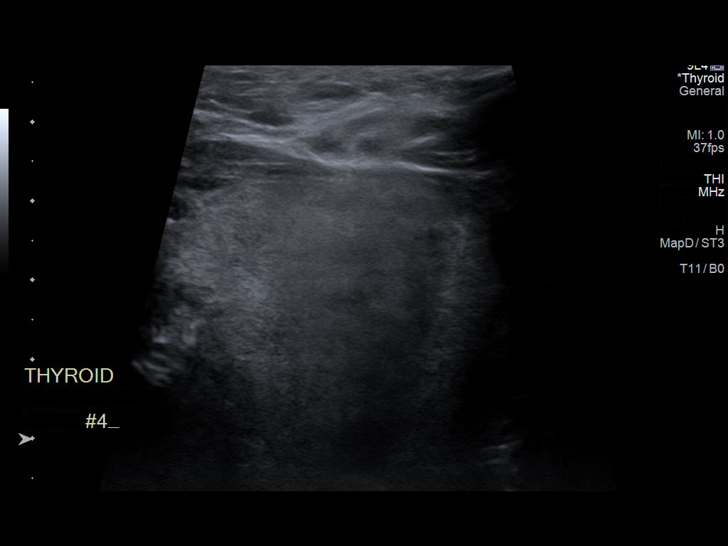
[im 14/17]
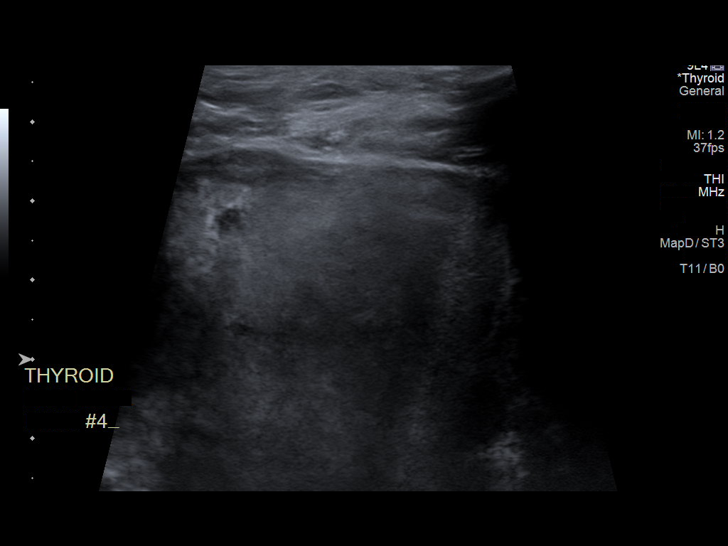
[im 16/17]
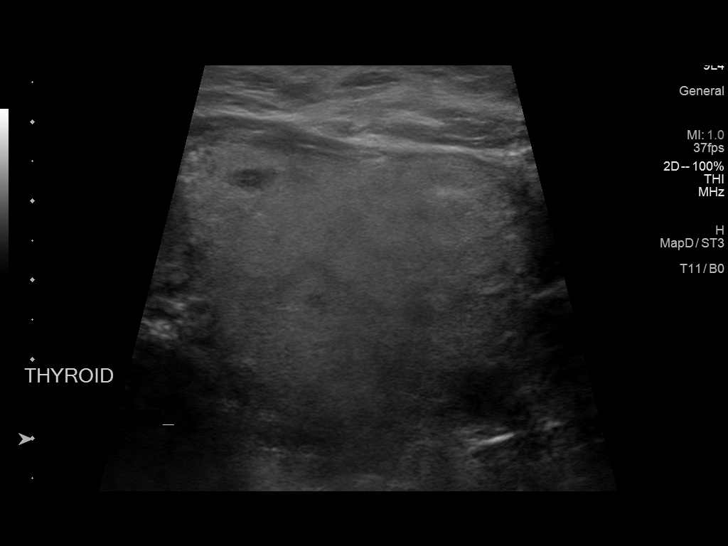
[im 17/17]
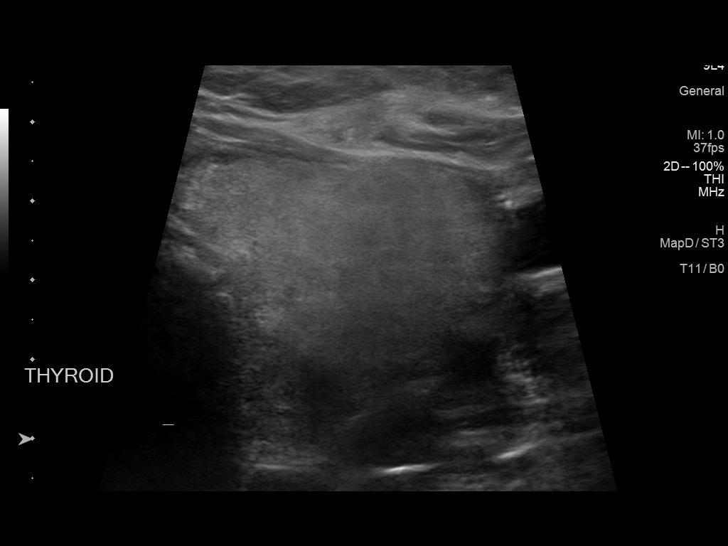

[13 of 17 positions shown; findings below may reference images not displayed]

Pre-procedural ultrasound scanning demonstrated unchanged size and
appearance of the indeterminate nodule within the left mid thyroid.

The procedure was planned. The neck was prepped in the usual sterile
fashion, and a sterile drape was applied covering the operative
field. A timeout was performed prior to the initiation of the
procedure. Local anesthesia was provided with 1% lidocaine.

Under direct ultrasound guidance, 4 FNA biopsies were performed of
the left mid thyroid nodule with a 25 gauge needle. Multiple
ultrasound images were saved for procedural documentation purposes.
The samples were prepared and submitted to pathology.

Limited post procedural scanning was negative for hematoma or
additional complication. Dressings were placed. The patient
tolerated the above procedures procedure well without immediate
postprocedural complication.
FINDINGS: Nodule reference number based on prior diagnostic ultrasound: 5

Maximum size: 5.1 cm

Location: Left; Mid

ACR TI-RADS risk category: TR3 (3 points)

Reason for biopsy: meets ACR TI-RADS criteria

Ultrasound imaging confirms appropriate placement of the needles
within the thyroid nodule.
IMPRESSION: Technically successful ultrasound guided fine needle aspiration of
left mid thyroid nodule.

## 2020-06-17 HISTORY — PX: THYROIDECTOMY: SHX17

## 2020-12-03 ENCOUNTER — Encounter (HOSPITAL_BASED_OUTPATIENT_CLINIC_OR_DEPARTMENT_OTHER): Payer: Self-pay | Admitting: *Deleted

## 2020-12-03 ENCOUNTER — Other Ambulatory Visit: Payer: Self-pay

## 2020-12-03 ENCOUNTER — Emergency Department (HOSPITAL_BASED_OUTPATIENT_CLINIC_OR_DEPARTMENT_OTHER)
Admission: EM | Admit: 2020-12-03 | Discharge: 2020-12-04 | Disposition: A | Discharge status: Home / Self Care | Payer: 59 | Attending: Emergency Medicine | Admitting: Emergency Medicine

## 2020-12-03 ENCOUNTER — Emergency Department (HOSPITAL_BASED_OUTPATIENT_CLINIC_OR_DEPARTMENT_OTHER): Payer: 59

## 2020-12-03 DIAGNOSIS — R1011 Right upper quadrant pain: Secondary | ICD-10-CM

## 2020-12-03 LAB — CBC WITH DIFFERENTIAL/PLATELET
Abs Immature Granulocytes: 0.02 10*3/uL (ref 0.00–0.07)
Basophils Absolute: 0 10*3/uL (ref 0.0–0.1)
Basophils Relative: 0 %
Eosinophils Absolute: 0.3 10*3/uL (ref 0.0–0.5)
Eosinophils Relative: 4 %
HCT: 41.4 % (ref 36.0–46.0)
Hemoglobin: 13.4 g/dL (ref 12.0–15.0)
Immature Granulocytes: 0 %
Lymphocytes Relative: 35 %
Lymphs Abs: 3.2 10*3/uL (ref 0.7–4.0)
MCH: 26.8 pg (ref 26.0–34.0)
MCHC: 32.4 g/dL (ref 30.0–36.0)
MCV: 82.8 fL (ref 80.0–100.0)
Monocytes Absolute: 0.7 10*3/uL (ref 0.1–1.0)
Monocytes Relative: 7 %
Neutro Abs: 4.8 10*3/uL (ref 1.7–7.7)
Neutrophils Relative %: 54 %
Platelets: 238 10*3/uL (ref 150–400)
RBC: 5 MIL/uL (ref 3.87–5.11)
RDW: 15.2 % (ref 11.5–15.5)
WBC: 9.1 10*3/uL (ref 4.0–10.5)
nRBC: 0 % (ref 0.0–0.2)

## 2020-12-03 LAB — URINALYSIS, ROUTINE W REFLEX MICROSCOPIC
Bilirubin Urine: NEGATIVE
Glucose, UA: NEGATIVE mg/dL
Ketones, ur: NEGATIVE mg/dL
Leukocytes,Ua: NEGATIVE
Nitrite: NEGATIVE
Protein, ur: NEGATIVE mg/dL
Specific Gravity, Urine: <1.005 — ABNORMAL LOW (ref 1.005–1.030)
pH: 6 (ref 5.0–8.0)

## 2020-12-03 LAB — COMPREHENSIVE METABOLIC PANEL
ALT: 46 U/L — ABNORMAL HIGH (ref 0–44)
AST: 43 U/L — ABNORMAL HIGH (ref 15–41)
Albumin: 3.5 g/dL (ref 3.5–5.0)
Alkaline Phosphatase: 50 U/L (ref 38–126)
Anion gap: 8 (ref 5–15)
BUN: 7 mg/dL (ref 6–20)
CO2: 25 mmol/L (ref 22–32)
Calcium: 7.9 mg/dL — ABNORMAL LOW (ref 8.9–10.3)
Chloride: 102 mmol/L (ref 98–111)
Creatinine, Ser: 0.72 mg/dL (ref 0.44–1.00)
GFR, Estimated: >60 mL/min (ref >60)
Glucose, Bld: 84 mg/dL (ref 70–99)
Potassium: 3.8 mmol/L (ref 3.5–5.1)
Sodium: 135 mmol/L (ref 135–145)
Total Bilirubin: 0.4 mg/dL (ref 0.3–1.2)
Total Protein: 7.4 g/dL (ref 6.5–8.1)

## 2020-12-03 LAB — LIPASE, BLOOD: Lipase: 35 U/L (ref 11–51)

## 2020-12-03 LAB — PREGNANCY, URINE: Preg Test, Ur: NEGATIVE

## 2020-12-03 NOTE — ED Provider Notes (Signed)
[facility] EMERGENCY DEPARTMENT Provider Note   CSN: [id number] Arrival date & time: 12/03/20  1859     History Chief Complaint  Patient presents with  . Abdominal Pain    Emily Houston is a 40 y.o. female medical history of GERD, cholecystectomy, thyroidectomy, presenting for evaluation of right upper quadrant abdominal pain.  She states 2 days ago she began having aching RUQ pain that wraps around to her side. Started 2 days ago, initially worse. Yesterday, more aching in back. Today, feels more in RUQ. Better with heating pad. No D/C/N/V. No urinary sx, no dysuria, no frequency or odor to urine. No F/C.   Went to PCP today who was planning to have outpatient right upper quadrant ultrasound done, however was unable to get it done in time.  Patient reports recent elevated LFTs.  Cholecystectomy in 2017.   The history is provided by the patient.       Past Medical History:  Diagnosis Date  . Abnormal uterine bleeding   . Allergy   . Anxiety   . Cholecystitis    chronic  . Depression   . Dysmenorrhea   . Fibroid   . GERD (gastroesophageal reflux disease)   . H/O seasonal allergies   . Hormone disorder   . Morbid obesity ([place])   . Sleep apnea    wears CPAP  . Thyroid disease     There are no problems to display for this patient.   Past Surgical History:  Procedure Laterality Date  . CESAREAN SECTION    . CHOLECYSTECTOMY N/A 03/15/2016   Procedure: LAPAROSCOPIC CHOLECYSTECTOMY;  Surgeon: Clovis Riley, MD;  Location: [facility];  Service: General;  Laterality: N/A;  . CHOLECYSTECTOMY       OB History    Gravida  1   Para  1   Term  1   Preterm      AB      Living  1     SAB      IAB      Ectopic      Multiple      Live Births  1           Family History  Problem Relation Age of Onset  . Hypertension Father   . Diabetes Father   . Heart disease Other   . Hyperlipidemia Maternal Grandfather   . Heart disease Maternal  Grandfather     Social History   Tobacco Use  . Smoking status: Never Smoker  . Smokeless tobacco: Never Used  Vaping Use  . Vaping Use: Never used  Substance Use Topics  . Alcohol use: No  . Drug use: No    Home Medications Prior to Admission medications   Medication Sig Start Date End Date Taking? Authorizing Provider  azithromycin (ZITHROMAX) 250 MG tablet Take 1 tablet (250 mg total) by mouth daily. Take first 2 tablets together, then 1 every day until finished. 03/13/19   Tacy Learn, PA-C  methocarbamol (ROBAXIN) 500 MG tablet Take 1 tablet (500 mg total) by mouth 2 (two) times daily. 09/18/18   Joy, Shawn C, PA-C  naproxen (NAPROSYN) 500 MG tablet Take 1 tablet (500 mg total) by mouth 2 (two) times daily. 09/08/18   Ashley Murrain, NP    Allergies    Penicillins and Aspirin  Review of Systems   Review of Systems  Constitutional: Negative for fever.  Respiratory: Negative for cough and shortness of breath.   Cardiovascular:  Negative for chest pain.  Gastrointestinal: Positive for abdominal pain. Negative for constipation, diarrhea, nausea and vomiting.  Genitourinary: Negative for dysuria and frequency.  All other systems reviewed and are negative.   Physical Exam Updated Vital Signs BP 112/72 (BP Location: Right Arm)   Pulse 78   Temp 98.4 F (36.9 C) (Oral)   Resp 19   Ht _0  (1.676 m)   Wt 135.6 kg   SpO2 100%   BMI 48.26 kg/m   Physical Exam Vitals and nursing note reviewed.  Constitutional:      General: She is not in acute distress.    Appearance: She is well-developed. She is not ill-appearing.     Comments: Morbidly obese  HENT:     Head: Normocephalic and atraumatic.  Eyes:     Conjunctiva/sclera: Conjunctivae normal.  Cardiovascular:     Rate and Rhythm: Normal rate and regular rhythm.  Pulmonary:     Effort: Pulmonary effort is normal. No respiratory distress.     Breath sounds: Normal breath sounds.  Abdominal:     General: Bowel  sounds are normal.     Palpations: Abdomen is soft.     Tenderness: There is abdominal tenderness (reported discomfort with palpation of RUQ and epigastric). There is no guarding or rebound.  Skin:    General: Skin is warm.  Neurological:     Mental Status: She is alert.  Psychiatric:        Behavior: Behavior normal.     ED Results / Procedures / Treatments   Labs (all labs ordered are listed, but only abnormal results are displayed) Labs Reviewed  COMPREHENSIVE METABOLIC PANEL - Abnormal; Notable for the following components:      Result Value   Calcium 7.9 (*)    AST 43 (*)    ALT 46 (*)    All other components within normal limits  URINALYSIS, ROUTINE W REFLEX MICROSCOPIC - Abnormal; Notable for the following components:   Specific Gravity, Urine <1.005 (*)    Hgb urine dipstick TRACE (*)    All other components within normal limits  URINALYSIS, MICROSCOPIC (REFLEX) - Abnormal; Notable for the following components:   Bacteria, UA RARE (*)    All other components within normal limits  URINE CULTURE  LIPASE, BLOOD  CBC WITH DIFFERENTIAL/PLATELET  PREGNANCY, URINE    EKG None  Radiology CT Abdomen Pelvis Wo Contrast  Result Date: 12/03/2020 CLINICAL DATA:  Right upper quadrant pain. EXAM: CT ABDOMEN AND PELVIS WITHOUT CONTRAST TECHNIQUE: Multidetector CT imaging of the abdomen and pelvis was performed following the standard protocol without IV contrast. COMPARISON:  None. FINDINGS: Lower chest: No acute abnormality. Hepatobiliary: No focal liver abnormality is seen. Status post cholecystectomy. No biliary dilatation. Pancreas: Unremarkable. No pancreatic ductal dilatation or surrounding inflammatory changes. Spleen: Normal in size without focal abnormality. Adrenals/Urinary Tract: Adrenal glands are unremarkable. Kidneys are normal, without renal calculi, focal lesion, or hydronephrosis. Bladder is unremarkable. Stomach/Bowel: Stomach is within normal limits. Appendix  appears normal. No evidence of bowel wall thickening, distention, or inflammatory changes. Noninflamed diverticula are seen throughout the ascending and sigmoid colon. Vascular/Lymphatic: No significant vascular findings are present. No enlarged abdominal or pelvic lymph nodes. Reproductive: Uterus and bilateral adnexa are unremarkable. Other: No abdominal wall hernia or abnormality. No abdominopelvic ascites. Musculoskeletal: No acute or significant osseous findings. IMPRESSION: 1. Evidence of prior cholecystectomy. 2. Colonic diverticulosis. Electronically Signed   By: Virgina Norfolk M.D.   On: 12/03/2020 23:13  Procedures Procedures   Medications Ordered in ED Medications - No data to display  ED Course  I have reviewed the triage vital signs and the nursing notes.  Pertinent labs & imaging results that were available during my care of the patient were reviewed by me and considered in my medical decision making (see chart for details).    MDM Rules/Calculators/A&P                          Patient presenting for evaluation of 2 days of right upper quadrant abdominal pain, patient is status post cystectomy in 2017.  States she was having abnormal LFTs outpatient.  Per review of medical record, minimally elevated LFTs are noted over the last couple months.  On exam today she has more discomfort than tenderness on exam.  No guarding or rebound.  She is well-appearing no distress.  CBC with normal white blood cell count and hemoglobin.  Metabolic panel shows normal renal function.  AST and ALT are very minimally elevated to 43 and 46 with normal alk phos and T bili.  Denies frequent Tylenol use or alcohol use.  Lipase within normal limits.  Urine prep negative. U/A not consistent with infection, appears to be contaminant specimen.  Urine culture sent considering patient's right sided abdominal/flank pain.  CT scan is negative for acute findings to account for patient's pain.  Recommend she  treat symptomatically as needed, trial of antacids.  PCP follow-up.  Patient is well-appearing and in no distress at discharge  Discussed results, findings, treatment and follow up. Patient advised of return precautions. Patient verbalized understanding and agreed with plan.    Final Clinical Impression(s) / ED Diagnoses Final diagnoses:  Right upper quadrant abdominal pain    Rx / DC Orders ED Discharge Orders    None       Kaleeah Gingerich, [place] N, PA-C 12/04/20 0007    Hayden Rasmussen, MD 12/04/20 [phone]

## 2020-12-03 NOTE — ED Triage Notes (Signed)
Sent here by PMD for [place] and right upper abd pain x 4 days and elevated liver enzymes

## 2020-12-04 LAB — URINALYSIS, MICROSCOPIC (REFLEX)

## 2020-12-04 NOTE — Discharge Instructions (Signed)
Please follow-up with your primary care provider if your abdominal pain persists.  Your CT scan is reassuring. Return if you develop fever, severely worsening pain, or other concerning symptoms.

## 2020-12-05 LAB — URINE CULTURE

## 2021-10-16 HISTORY — PX: OTHER SURGICAL HISTORY: SHX169

## 2022-07-18 NOTE — L&D Delivery Note (Signed)
Delivery Note Emily Houston is a G2P1001 at [redacted]w[redacted]d who had a spontaneous delivery at 1232 a viable baby girl was delivered via spontaneous vaginal delivery (Presentation: ROA ). APGAR: 8,9 ; weight 6 lb. 3 oz..     Admitted for SROM in latent labor with history of previous cesarean section x 1. On admission, she decided for elective repeat cesarean section. After decision for OR, patient began having decelerations and vaginal bleeding, progressing quickly to complete. This was discussed with her and given the rapid progression, she consented to trial of labor over stat cesarean section. Decelerations in the second stage were present but variability remained moderate and fetus recovered between with good maternal effort.  Head was delivered without difficulty followed by anterior and posterior shoulders with the next push. Thick meconium stained fluid was noted on delivery that was not seen previously. No nuchal cord.  Baby was placed skin to skin on mother's abdomen. Delayed cord clamping for 60 seconds.  Delivery of placenta was spontaneous. Tranexemic acid was administered to decrease bleeding. Placenta was found to be intact, 3 -vessel cord was noted. The fundus was found to be firm. The bladder was drained with a straight catheter after prepping with iodine. A 1st degree perineal laceration was repaired in the normal sterile fashion with 2-0 vicryl, followed by a periurethral laceration and a right labial laceration with 3-0 chromic.. Estimated blood loss 178 cc. Cord gases not sent. Instrument and gauze counts were correct at the end of the procedure.  Given precipitous labor, with initial plan for cesarean section, GBS was untreated.   Placenta status: intact, to pathology .  Cord:  umbilical cyst: .  Cord pH: N/A  Anesthesia:  local Episiotomy:  N/A Lacerations:  1st degree, periurethral, R labial Suture Repair: 2.0 vicryl, 3-0 chromic Est. Blood Loss (mL):  178  Mom to postpartum.   Baby to Couplet care / Skin to Skin.  Junius Creamer 05/09/2023, 1:07 PM

## 2022-10-26 LAB — OB RESULTS CONSOLE HIV ANTIBODY (ROUTINE TESTING): HIV: NONREACTIVE

## 2022-10-26 LAB — OB RESULTS CONSOLE GC/CHLAMYDIA
Chlamydia: NEGATIVE
Neisseria Gonorrhea: NEGATIVE

## 2022-10-26 LAB — OB RESULTS CONSOLE HEPATITIS B SURFACE ANTIGEN: Hepatitis B Surface Ag: NEGATIVE

## 2022-10-26 LAB — OB RESULTS CONSOLE RUBELLA ANTIBODY, IGM: Rubella: IMMUNE

## 2022-10-26 LAB — OB RESULTS CONSOLE RPR: RPR: NONREACTIVE

## 2023-02-13 LAB — OB RESULTS CONSOLE HIV ANTIBODY (ROUTINE TESTING): HIV: NONREACTIVE

## 2023-02-13 LAB — OB RESULTS CONSOLE RPR: RPR: NONREACTIVE

## 2023-04-12 ENCOUNTER — Encounter (HOSPITAL_COMMUNITY): Payer: Self-pay | Admitting: Obstetrics and Gynecology

## 2023-04-12 ENCOUNTER — Observation Stay (HOSPITAL_COMMUNITY)
Admission: RE | Admit: 2023-04-12 | Discharge: 2023-04-13 | Disposition: A | Discharge status: Home / Self Care | Payer: No Typology Code available for payment source | Attending: Obstetrics and Gynecology | Admitting: Obstetrics and Gynecology

## 2023-04-12 ENCOUNTER — Other Ambulatory Visit: Payer: Self-pay

## 2023-04-12 DIAGNOSIS — Z98891 History of uterine scar from previous surgery: Secondary | ICD-10-CM | POA: Diagnosis not present

## 2023-04-12 DIAGNOSIS — O368190 Decreased fetal movements, unspecified trimester, not applicable or unspecified: Principal | ICD-10-CM | POA: Diagnosis present

## 2023-04-12 DIAGNOSIS — Z3A35 35 weeks gestation of pregnancy: Secondary | ICD-10-CM | POA: Diagnosis not present

## 2023-04-12 DIAGNOSIS — O09523 Supervision of elderly multigravida, third trimester: Secondary | ICD-10-CM | POA: Insufficient documentation

## 2023-04-12 DIAGNOSIS — O368130 Decreased fetal movements, third trimester, not applicable or unspecified: Principal | ICD-10-CM | POA: Insufficient documentation

## 2023-04-12 DIAGNOSIS — O99283 Endocrine, nutritional and metabolic diseases complicating pregnancy, third trimester: Secondary | ICD-10-CM | POA: Insufficient documentation

## 2023-04-12 DIAGNOSIS — E039 Hypothyroidism, unspecified: Secondary | ICD-10-CM | POA: Insufficient documentation

## 2023-04-12 LAB — TYPE AND SCREEN
ABO/RH(D): A POS
Antibody Screen: NEGATIVE

## 2023-04-12 LAB — CBC
HCT: 25.6 % — ABNORMAL LOW (ref 36.0–46.0)
Hemoglobin: 7.8 g/dL — ABNORMAL LOW (ref 12.0–15.0)
MCH: 22.7 pg — ABNORMAL LOW (ref 26.0–34.0)
MCHC: 30.5 g/dL (ref 30.0–36.0)
MCV: 74.4 fL — ABNORMAL LOW (ref 80.0–100.0)
Platelets: 232 10*3/uL (ref 150–400)
RBC: 3.44 MIL/uL — ABNORMAL LOW (ref 3.87–5.11)
RDW: 17.3 % — ABNORMAL HIGH (ref 11.5–15.5)
WBC: 7.9 10*3/uL (ref 4.0–10.5)
nRBC: 0 % (ref 0.0–0.2)

## 2023-04-12 MED ORDER — POLYSACCHARIDE IRON COMPLEX 150 MG PO CAPS
150.0000 mg | ORAL_CAPSULE | Freq: Every day | ORAL | Status: DC
  Administered 2023-04-12 – 2023-04-13 (×2): 150 mg via ORAL
  Filled 2023-04-12 (×2): qty 1

## 2023-04-12 MED ORDER — PRENATAL MULTIVITAMIN CH
1.0000 | ORAL_TABLET | Freq: Every day | ORAL | Status: DC
  Administered 2023-04-12: 1 via ORAL
  Filled 2023-04-12: qty 1

## 2023-04-12 MED ORDER — ZOLPIDEM TARTRATE 5 MG PO TABS: 5.0000 mg | ORAL_TABLET | Freq: Every evening | ORAL | Status: DC | PRN

## 2023-04-12 MED ORDER — CALCIUM CARBONATE ANTACID 500 MG PO CHEW: 2.0000 | CHEWABLE_TABLET | ORAL | Status: DC | PRN

## 2023-04-12 MED ORDER — ACETAMINOPHEN 325 MG PO TABS: 650.0000 mg | ORAL_TABLET | ORAL | Status: DC | PRN

## 2023-04-12 MED ORDER — DOCUSATE SODIUM 100 MG PO CAPS
100.0000 mg | ORAL_CAPSULE | Freq: Every day | ORAL | Status: DC
  Administered 2023-04-13: 100 mg via ORAL
  Filled 2023-04-12: qty 1

## 2023-04-12 MED ORDER — LEVOTHYROXINE SODIUM 25 MCG PO TABS
125.0000 ug | ORAL_TABLET | Freq: Every day | ORAL | Status: DC
  Administered 2023-04-13: 125 ug via ORAL
  Filled 2023-04-12: qty 5

## 2023-04-12 NOTE — H&P (Signed)
Emily Houston is a 42 y.o. female, G2 P30, EGA [redacted]W[redacted]D with [facility] 05-14-23 presenting for prolonged fetal monitoring.  She was seen in the office yesterday for reduced fetal movement, NST nonreactive, BPP 6/10.  On reevaluation today, NST stil  nonreactive, BPP still 6/8.  She is noticing a bit more fetal movement today.  No VB, no LOF, no regular ctx.  PNC complicated by hypothyroidism followed closely on medication, h/o LTCS and wants VBAC, h/o gastric bypass, amenia on iron, AMA with low risk NIPT.  OB History     Gravida  2   Para  1   Term  1   Preterm      AB      Living  1      SAB      IAB      Ectopic      Multiple      Live Births  1          Past Medical History:  Diagnosis Date   Abnormal uterine bleeding    Allergy    Anxiety    Cholecystitis    chronic   Depression    Dysmenorrhea    Fibroid    GERD (gastroesophageal reflux disease)    H/O seasonal allergies    Hormone disorder    Morbid obesity (HCC)    Sleep apnea    wears CPAP   Thyroid disease    Past Surgical History:  Procedure Laterality Date   CESAREAN SECTION     CHOLECYSTECTOMY N/A 03/15/2016   Procedure: LAPAROSCOPIC CHOLECYSTECTOMY;  Surgeon: Berna Bue, MD;  Location: [facility] OR;  Service: General;  Laterality: N/A;   CHOLECYSTECTOMY     gastric bypass  10/2021   THYROIDECTOMY  06/2020   Family History: family history includes Diabetes in her father; Heart disease in her maternal grandfather and another family member; Hyperlipidemia in her maternal grandfather; Hypertension in her father. Social History:  reports that she has never smoked. She has never used smokeless tobacco. She reports that she does not drink alcohol and does not use drugs.    Review of Systems  Respiratory: Negative.    Cardiovascular: Negative.    Maternal Medical History:  Fetal activity: Perceived fetal activity is decreased.       Blood pressure 96/62, pulse 72, temperature 98.3 F (36.8  C), temperature source Oral, resp. rate 17, height 5\' 5"  (1.651 m), weight 86.6 kg, SpO2 100%. Maternal Exam:  Uterine Assessment: Contraction strength is mild.  Contraction frequency is rare.  Abdomen: Patient reports no abdominal tenderness. Surgical scars: low transverse.     Fetal Exam Fetal Monitor Review: Mode: ultrasound.   Baseline rate: 120-130.  Variability: moderate (6-25 bpm).   Pattern: accelerations present and no decelerations.   Fetal State Assessment: Category I - tracings are normal.   Physical Exam Vitals reviewed.  Constitutional:      Appearance: Normal appearance.  Cardiovascular:     Rate and Rhythm: Normal rate and regular rhythm.  Pulmonary:     Effort: Pulmonary effort is normal. No respiratory distress.  Abdominal:     Palpations: Abdomen is soft.  Neurological:     Mental Status: She is alert.     Prenatal labs: ABO, Rh: --/--/A POS (09/25 1306) Antibody: NEG (09/25 1306) Rubella: Immune (04/10 0000) RPR: Nonreactive (04/10 0000)  HBsAg: Negative (04/10 0000)  HIV: Non-reactive (04/10 0000)    Assessment/Plan: IUP at [redacted]W[redacted]D with reduced fetal movement and BPP 6/10  2 days in a row, admitted for prolonged fetal monitoring and repeat BPP in morning.  FHT since here is reactive.   Emily Houston 04/12/2023, 5:19 PM

## 2023-04-12 NOTE — Plan of Care (Signed)
°  Problem: Education: Goal: Knowledge of General Education information will improve Description: Including pain rating scale, medication(s)/side effects and non-pharmacologic comfort measures Outcome: Completed/Met

## 2023-04-13 ENCOUNTER — Observation Stay (HOSPITAL_COMMUNITY): Payer: No Typology Code available for payment source

## 2023-04-13 DIAGNOSIS — O368130 Decreased fetal movements, third trimester, not applicable or unspecified: Secondary | ICD-10-CM

## 2023-04-13 DIAGNOSIS — O09523 Supervision of elderly multigravida, third trimester: Secondary | ICD-10-CM | POA: Diagnosis not present

## 2023-04-13 DIAGNOSIS — O34219 Maternal care for unspecified type scar from previous cesarean delivery: Secondary | ICD-10-CM

## 2023-04-13 DIAGNOSIS — Z3A35 35 weeks gestation of pregnancy: Secondary | ICD-10-CM

## 2023-04-13 NOTE — Plan of Care (Signed)
Problem: Health Behavior/Discharge Planning: Goal: Ability to manage health-related needs will improve 04/13/2023 1050 by Samuella Cota, RN Outcome: Completed/Met 04/13/2023 1050 by Samuella Cota, RN Outcome: Progressing 04/13/2023 1050 by Samuella Cota, RN Outcome: Progressing   Problem: Clinical Measurements: Goal: Ability to maintain clinical measurements within normal limits will improve 04/13/2023 1050 by Samuella Cota, RN Outcome: Completed/Met 04/13/2023 1050 by Samuella Cota, RN Outcome: Progressing 04/13/2023 1050 by Samuella Cota, RN Outcome: Progressing Goal: Will remain free from infection 04/13/2023 1050 by Samuella Cota, RN Outcome: Completed/Met 04/13/2023 1050 by Samuella Cota, RN Outcome: Progressing 04/13/2023 1050 by Samuella Cota, RN Outcome: Progressing Goal: Diagnostic test results will improve 04/13/2023 1050 by Samuella Cota, RN Outcome: Completed/Met 04/13/2023 1050 by Samuella Cota, RN Outcome: Progressing 04/13/2023 1050 by Samuella Cota, RN Outcome: Progressing Goal: Respiratory complications will improve 04/13/2023 1050 by Samuella Cota, RN Outcome: Completed/Met 04/13/2023 1050 by Samuella Cota, RN Outcome: Progressing 04/13/2023 1050 by Samuella Cota, RN Outcome: Progressing Goal: Cardiovascular complication will be avoided 04/13/2023 1050 by Samuella Cota, RN Outcome: Completed/Met 04/13/2023 1050 by Samuella Cota, RN Outcome: Progressing 04/13/2023 1050 by Samuella Cota, RN Outcome: Progressing   Problem: Activity: Goal: Risk for activity intolerance will decrease 04/13/2023 1050 by Samuella Cota, RN Outcome: Completed/Met 04/13/2023 1050 by Samuella Cota, RN Outcome: Progressing 04/13/2023 1050 by Samuella Cota, RN Outcome: Progressing   Problem: Nutrition: Goal: Adequate nutrition will be maintained 04/13/2023 1050 by Samuella Cota, RN Outcome: Completed/Met 04/13/2023 1050 by Samuella Cota, RN Outcome: Progressing 04/13/2023 1050 by Samuella Cota, RN Outcome: Progressing   Problem: Coping: Goal: Level of anxiety will decrease 04/13/2023 1050 by Samuella Cota, RN Outcome: Completed/Met 04/13/2023 1050 by Samuella Cota, RN Outcome: Progressing 04/13/2023 1050 by Samuella Cota, RN Outcome: Progressing   Problem: Elimination: Goal: Will not experience complications related to bowel motility 04/13/2023 1050 by Samuella Cota, RN Outcome: Completed/Met 04/13/2023 1050 by Samuella Cota, RN Outcome: Progressing 04/13/2023 1050 by Samuella Cota, RN Outcome: Progressing Goal: Will not experience complications related to urinary retention 04/13/2023 1050 by Samuella Cota, RN Outcome: Completed/Met 04/13/2023 1050 by Samuella Cota, RN Outcome: Progressing 04/13/2023 1050 by Samuella Cota, RN Outcome: Progressing   Problem: Pain Managment: Goal: General experience of comfort will improve 04/13/2023 1050 by Samuella Cota, RN Outcome: Completed/Met 04/13/2023 1050 by Samuella Cota, RN Outcome: Progressing 04/13/2023 1050 by Samuella Cota, RN Outcome: Progressing   Problem: Safety: Goal: Ability to remain free from injury will improve 04/13/2023 1050 by Samuella Cota, RN Outcome: Completed/Met 04/13/2023 1050 by Samuella Cota, RN Outcome: Progressing 04/13/2023 1050 by Samuella Cota, RN Outcome: Progressing   Problem: Skin Integrity: Goal: Risk for impaired skin integrity will decrease 04/13/2023 1050 by Samuella Cota, RN Outcome: Completed/Met 04/13/2023 1050 by Samuella Cota, RN Outcome: Progressing 04/13/2023 1050 by Samuella Cota, RN Outcome: Progressing   Problem: Education: Goal: Knowledge of disease or condition will improve 04/13/2023 1050 by Samuella Cota, RN Outcome: Completed/Met 04/13/2023 1050 by Samuella Cota, RN Outcome: Progressing 04/13/2023 1050 by Samuella Cota, RN Outcome: Progressing Goal: Knowledge of the prescribed therapeutic regimen will improve 04/13/2023 1050 by Samuella Cota, RN Outcome:  Completed/Met 04/13/2023 1050 by Samuella Cota, RN Outcome: Progressing 04/13/2023 1050 by Samuella Cota, RN Outcome: Progressing Goal: Individualized Educational  Video(s) 04/13/2023 1050 by Samuella Cota, RN Outcome: Completed/Met 04/13/2023 1050 by Samuella Cota, RN Outcome: Progressing 04/13/2023 1050 by Samuella Cota, RN Outcome: Progressing   Problem: Clinical Measurements: Goal: Complications related to the disease process, condition or treatment will be avoided or minimized 04/13/2023 1050 by Samuella Cota, RN Outcome: Completed/Met 04/13/2023 1050 by Samuella Cota, RN Outcome: Progressing 04/13/2023 1050 by Samuella Cota, RN Outcome: Progressing

## 2023-04-13 NOTE — Discharge Summary (Signed)
Postpartum Discharge Summary  Date of Service updated 04/13/23     Patient Name: Emily Houston DOB: 01/22/1981 MRN: [id number]  Date of admission: 04/12/2023 Delivery date:This patient has no babies on file. Delivering provider: This patient has no babies on file. Date of discharge: 04/13/2023  Admitting diagnosis: Decreased fetal movement [O36.8190] Intrauterine pregnancy: [redacted]w[redacted]d     Secondary diagnosis:  Principal Problem:   Decreased fetal movement  Additional problems: Advanced maternal age    Discharge diagnosis:  decreased fetal movement                                                 Hospital course: 42 y.o. female, G2 P60, EGA [redacted]W[redacted]D with [facility] 05-14-23 presented for extended fetal monitoring.  She was seen in the office 04/11/23 for reduced fetal movement, NST nonreactive, BPP 6/10.  On reevaluation 0/25/24, NST stil  nonreactive, BPP still 6/8.  She is noticing a bit more fetal movement today.  No VB, no LOF, no regular ctx.  PNC complicated by hypothyroidism followed closely on medication, h/o LTCS and wants VBAC, h/o gastric bypass, amenia on iron, AMA with low risk NIPT.   She was admitted to antepartum for extended fetal monitoring overnight.  There were no fetal heart rate decelerations noted.  Variability moderate and reactive tracing throughout hospitalization.  On the morning of HD#1, repeat BPP was 10/10.  She was discharged to home with planned f/u in the office next week   Physical exam  Vitals:   04/12/23 1249 04/12/23 1945 04/13/23 0619 04/13/23 0745  BP: 96/62 105/65 93/60 94/69   Pulse: 72 69 76 77  Resp: 17 17 18 17   Temp: 98.3 F (36.8 C) 98.1 F (36.7 C) 98 F (36.7 C) 98.7 F (37.1 C)  TempSrc: Oral Oral Oral Oral  SpO2: 100% 100% 97% 98%  Weight: 86.6 kg     Height: 5\' 5"  (1.651 m)      General: alert, cooperative, and no distress   After visit meds:  Allergies as of 04/13/2023       Reactions   Penicillins Other (See Comments)    Has patient had a PCN reaction causing immediate rash, facial/tongue/throat swelling, SOB or lightheadedness with hypotension: Unknown Has patient had a PCN reaction causing severe rash involving mucus membranes or skin necrosis: Unknown Has patient had a PCN reaction that required hospitalization: Unknown Has patient had a PCN reaction occurring within the last 10 years: No If all of the above answers are "NO", then may proceed with Cephalosporin use.   Aspirin Other (See Comments)   "Dizziness."         Medication List     STOP taking these medications    azithromycin 250 MG tablet Commonly known as: ZITHROMAX   methocarbamol 500 MG tablet Commonly known as: ROBAXIN   naproxen 500 MG tablet Commonly known as: NAPROSYN        Follow up Visit:  Follow-up Information     [facility] Follow up in 1 week(s).   Contact information: [address]  [address] [place] [address] [phone]                     04/13/2023 [facility] GEFFEL Chestine Spore, MD

## 2023-04-13 NOTE — Progress Notes (Signed)
Patient admitted overnight for extended fetal monitoring in the setting of decreased fetal movement on Tuesday and equivocal fetal testing.  Reports significant stress at home (new position, unexpected pregnancy).  Since being able to rest overnight, feels more relaxed.  Fetal movement has been normal  NST have been reactive throughout the evening without any fetal heart rate decelerations BPP today 8/8 (10/10 including NST)  At this time, will discharge patient to home FM and labor precautions reviewed  Keep scheduled appointment next week with Dr. Reina Fuse She is getting set up to speak with someone through her work's employee assistance program

## 2023-04-13 NOTE — Plan of Care (Signed)
?  Problem: Health Behavior/Discharge Planning: ?Goal: Ability to manage health-related needs will improve ?Outcome: Progressing ?  ?Problem: Clinical Measurements: ?Goal: Ability to maintain clinical measurements within normal limits will improve ?Outcome: Progressing ?Goal: Will remain free from infection ?Outcome: Progressing ?Goal: Diagnostic test results will improve ?Outcome: Progressing ?Goal: Respiratory complications will improve ?Outcome: Progressing ?Goal: Cardiovascular complication will be avoided ?Outcome: Progressing ?  ?Problem: Activity: ?Goal: Risk for activity intolerance will decrease ?Outcome: Progressing ?  ?Problem: Nutrition: ?Goal: Adequate nutrition will be maintained ?Outcome: Progressing ?  ?Problem: Coping: ?Goal: Level of anxiety will decrease ?Outcome: Progressing ?  ?Problem: Elimination: ?Goal: Will not experience complications related to bowel motility ?Outcome: Progressing ?Goal: Will not experience complications related to urinary retention ?Outcome: Progressing ?  ?Problem: Pain Managment: ?Goal: General experience of comfort will improve ?Outcome: Progressing ?  ?Problem: Safety: ?Goal: Ability to remain free from injury will improve ?Outcome: Progressing ?  ?Problem: Skin Integrity: ?Goal: Risk for impaired skin integrity will decrease ?Outcome: Progressing ?  ?Problem: Education: ?Goal: Knowledge of disease or condition will improve ?Outcome: Progressing ?Goal: Knowledge of the prescribed therapeutic regimen will improve ?Outcome: Progressing ?Goal: Individualized Educational Video(s) ?Outcome: Progressing ?  ?Problem: Clinical Measurements: ?Goal: Complications related to the disease process, condition or treatment will be avoided or minimized ?Outcome: Progressing ?  ?

## 2023-04-20 LAB — OB RESULTS CONSOLE GBS: GBS: POSITIVE

## 2023-04-25 NOTE — Patient Instructions (Signed)
Bassheva Flury  04/25/2023   Your procedure is scheduled on:  05/09/2023  Arrive at 0530 at Entrance C on [address] at [facility]  and [employer]. You are invited to use the FREE valet parking or use the Visitor's parking deck.  Pick up the phone at the desk and dial [phone].  Call this number if you have problems the morning of surgery: [phone]  Remember:   Do not eat food:(After Midnight) Desps de medianoche.  Do not drink clear liquids: (After Midnight) Desps de medianoche.  Take these medicines the morning of surgery with A SIP OF WATER:  Calcitriol and levothyroxine   Do not wear jewelry, make-up or nail polish.  Do not wear lotions, powders, or perfumes. Do not wear deodorant.  Do not shave 48 hours prior to surgery.  Do not bring valuables to the hospital.  [facility] is not   responsible for any belongings or valuables brought to the hospital.  Contacts, dentures or bridgework may not be worn into surgery.  Leave suitcase in the car. After surgery it may be brought to your room.  For patients admitted to the hospital, checkout time is 11:00 AM the day of              discharge.      Please read over the following fact sheets that you were given:     Preparing for Surgery

## 2023-04-26 ENCOUNTER — Non-Acute Institutional Stay (HOSPITAL_COMMUNITY)
Admission: RE | Admit: 2023-04-26 | Discharge: 2023-04-26 | Disposition: A | Discharge status: Home / Self Care | Payer: No Typology Code available for payment source | Source: Ambulatory Visit | Attending: Internal Medicine | Admitting: Internal Medicine

## 2023-04-26 DIAGNOSIS — O99019 Anemia complicating pregnancy, unspecified trimester: Secondary | ICD-10-CM | POA: Diagnosis present

## 2023-04-26 DIAGNOSIS — Z3A00 Weeks of gestation of pregnancy not specified: Secondary | ICD-10-CM | POA: Diagnosis not present

## 2023-04-26 MED ORDER — SODIUM CHLORIDE 0.9 % IV SOLN: INTRAVENOUS | Status: DC | PRN

## 2023-04-26 MED ORDER — NON FORMULARY: 510.0000 mg | Freq: Once | Status: DC

## 2023-04-26 MED ORDER — SODIUM CHLORIDE 0.9 % IV SOLN
510.0000 mg | Freq: Once | INTRAVENOUS | Status: AC
  Administered 2023-04-26: 510 mg via INTRAVENOUS
  Filled 2023-04-26: qty 17

## 2023-04-26 NOTE — Progress Notes (Signed)
PATIENT CARE CENTER NOTE:  Diagnosis: O99.019 anemia complicating pregnancy, unspecified trimester  Provider: Ellison Hughs MD  Procedure: Feraheme 510mg  infusion   Patient received IV Feraheme ( dose #1 of 2). No premeds required per orders. Observed for at least 30 minutes post infusion. No s/s reaction noted. Tolerated well, vitals stable, discharge instructions given , verbalized understanding. Pt instructed to schedule next infusion in 1 week at front desk prior to leaving, verbalized understanding. Patient alert, oriented, and ambulatory at the time of discharge.

## 2023-04-27 ENCOUNTER — Encounter (HOSPITAL_COMMUNITY): Payer: Self-pay

## 2023-05-03 ENCOUNTER — Non-Acute Institutional Stay (HOSPITAL_COMMUNITY)
Admission: RE | Admit: 2023-05-03 | Discharge: 2023-05-03 | Disposition: A | Discharge status: Home / Self Care | Payer: No Typology Code available for payment source | Source: Ambulatory Visit | Attending: Internal Medicine | Admitting: Internal Medicine

## 2023-05-03 DIAGNOSIS — D649 Anemia, unspecified: Secondary | ICD-10-CM | POA: Diagnosis not present

## 2023-05-03 DIAGNOSIS — O99019 Anemia complicating pregnancy, unspecified trimester: Secondary | ICD-10-CM | POA: Diagnosis present

## 2023-05-03 MED ORDER — SODIUM CHLORIDE 0.9 % IV SOLN: INTRAVENOUS | Status: DC | PRN

## 2023-05-03 MED ORDER — SODIUM CHLORIDE 0.9 % IV SOLN
510.0000 mg | Freq: Once | INTRAVENOUS | Status: AC
  Administered 2023-05-03: 510 mg via INTRAVENOUS
  Filled 2023-05-03: qty 510

## 2023-05-03 NOTE — Progress Notes (Signed)
PATIENT CARE CENTER NOTE  Diagnosis:O99.019 anemia complicating pregnancy, unspecified trimester    Provider: Ellison Hughs MD   Procedure: Feraheme 510 mg  Note: Patient received Feraheme 510 mg (dose #2 of 2). No premeds required per orders. Pt tolerated infusion with no adverse reaction. Pt observed for 30 minutes post infusion. Vital signs stable, AVS offered, but pt declined. Pt is alert, oriented, and ambulatory at discharge.

## 2023-05-08 ENCOUNTER — Encounter (HOSPITAL_COMMUNITY): Payer: Self-pay | Admitting: *Deleted

## 2023-05-08 ENCOUNTER — Encounter (HOSPITAL_COMMUNITY)
Admission: RE | Admit: 2023-05-08 | Discharge: 2023-05-08 | Disposition: A | Discharge status: Home / Self Care | Payer: No Typology Code available for payment source | Source: Ambulatory Visit | Attending: Obstetrics and Gynecology | Admitting: Obstetrics and Gynecology

## 2023-05-08 ENCOUNTER — Telehealth (HOSPITAL_COMMUNITY): Payer: Self-pay | Admitting: *Deleted

## 2023-05-08 NOTE — Telephone Encounter (Signed)
Preadmission screen  

## 2023-05-09 ENCOUNTER — Inpatient Hospital Stay (HOSPITAL_COMMUNITY)
Admission: AD | Admit: 2023-05-09 | Discharge: 2023-05-11 | DRG: 807 | Disposition: A | Discharge status: Home / Self Care | Payer: No Typology Code available for payment source | Attending: Student | Admitting: Student

## 2023-05-09 ENCOUNTER — Encounter (HOSPITAL_COMMUNITY)
Admission: AD | Disposition: A | Discharge status: Home / Self Care | Payer: Self-pay | Source: Home / Self Care | Attending: Student

## 2023-05-09 ENCOUNTER — Inpatient Hospital Stay (HOSPITAL_COMMUNITY)
Admission: AD | Admit: 2023-05-09 | Payer: No Typology Code available for payment source | Source: Home / Self Care | Admitting: Obstetrics and Gynecology

## 2023-05-09 ENCOUNTER — Encounter (HOSPITAL_COMMUNITY): Payer: Self-pay | Admitting: Anesthesiology

## 2023-05-09 ENCOUNTER — Other Ambulatory Visit: Payer: Self-pay

## 2023-05-09 ENCOUNTER — Encounter (HOSPITAL_COMMUNITY): Payer: Self-pay | Admitting: Obstetrics and Gynecology

## 2023-05-09 ENCOUNTER — Encounter (HOSPITAL_COMMUNITY): Admission: AD | Payer: Self-pay | Source: Home / Self Care

## 2023-05-09 DIAGNOSIS — Z833 Family history of diabetes mellitus: Secondary | ICD-10-CM | POA: Diagnosis not present

## 2023-05-09 DIAGNOSIS — Z9049 Acquired absence of other specified parts of digestive tract: Secondary | ICD-10-CM

## 2023-05-09 DIAGNOSIS — D509 Iron deficiency anemia, unspecified: Secondary | ICD-10-CM | POA: Diagnosis present

## 2023-05-09 DIAGNOSIS — O4292 Full-term premature rupture of membranes, unspecified as to length of time between rupture and onset of labor: Secondary | ICD-10-CM | POA: Diagnosis present

## 2023-05-09 DIAGNOSIS — Z8249 Family history of ischemic heart disease and other diseases of the circulatory system: Secondary | ICD-10-CM | POA: Diagnosis not present

## 2023-05-09 DIAGNOSIS — Z85850 Personal history of malignant neoplasm of thyroid: Secondary | ICD-10-CM | POA: Diagnosis not present

## 2023-05-09 DIAGNOSIS — Z3A39 39 weeks gestation of pregnancy: Secondary | ICD-10-CM | POA: Diagnosis not present

## 2023-05-09 DIAGNOSIS — O9902 Anemia complicating childbirth: Secondary | ICD-10-CM | POA: Diagnosis present

## 2023-05-09 DIAGNOSIS — O99844 Bariatric surgery status complicating childbirth: Secondary | ICD-10-CM | POA: Diagnosis present

## 2023-05-09 DIAGNOSIS — O34219 Maternal care for unspecified type scar from previous cesarean delivery: Principal | ICD-10-CM | POA: Insufficient documentation

## 2023-05-09 DIAGNOSIS — O34211 Maternal care for low transverse scar from previous cesarean delivery: Secondary | ICD-10-CM | POA: Diagnosis present

## 2023-05-09 LAB — COMPREHENSIVE METABOLIC PANEL
ALT: 15 U/L (ref 0–44)
AST: 30 U/L (ref 15–41)
Albumin: 2.4 g/dL — ABNORMAL LOW (ref 3.5–5.0)
Alkaline Phosphatase: 141 U/L — ABNORMAL HIGH (ref 38–126)
Anion gap: 11 (ref 5–15)
BUN: 5 mg/dL — ABNORMAL LOW (ref 6–20)
CO2: 19 mmol/L — ABNORMAL LOW (ref 22–32)
Calcium: 7.4 mg/dL — ABNORMAL LOW (ref 8.9–10.3)
Chloride: 107 mmol/L (ref 98–111)
Creatinine, Ser: 0.73 mg/dL (ref 0.44–1.00)
GFR, Estimated: >60 mL/min (ref >60)
Glucose, Bld: 89 mg/dL (ref 70–99)
Potassium: 3.3 mmol/L — ABNORMAL LOW (ref 3.5–5.1)
Sodium: 137 mmol/L (ref 135–145)
Total Bilirubin: 0.8 mg/dL (ref 0.3–1.2)
Total Protein: 6.3 g/dL — ABNORMAL LOW (ref 6.5–8.1)

## 2023-05-09 LAB — CBC
HCT: 31.4 % — ABNORMAL LOW (ref 36.0–46.0)
Hemoglobin: 9.7 g/dL — ABNORMAL LOW (ref 12.0–15.0)
MCH: 24 pg — ABNORMAL LOW (ref 26.0–34.0)
MCHC: 30.9 g/dL (ref 30.0–36.0)
MCV: 77.5 fL — ABNORMAL LOW (ref 80.0–100.0)
Platelets: 213 10*3/uL (ref 150–400)
RBC: 4.05 MIL/uL (ref 3.87–5.11)
RDW: 22.6 % — ABNORMAL HIGH (ref 11.5–15.5)
WBC: 12.4 10*3/uL — ABNORMAL HIGH (ref 4.0–10.5)
nRBC: 0 % (ref 0.0–0.2)

## 2023-05-09 LAB — TYPE AND SCREEN
ABO/RH(D): A POS
Antibody Screen: NEGATIVE

## 2023-05-09 LAB — POCT FERN TEST: POCT Fern Test: POSITIVE

## 2023-05-09 SURGERY — Surgical Case
Anesthesia: Spinal | Laterality: Bilateral

## 2023-05-09 SURGERY — Surgical Case
Anesthesia: Regional

## 2023-05-09 MED ORDER — PHENYLEPHRINE HCL-NACL 20-0.9 MG/250ML-% IV SOLN
INTRAVENOUS | Status: AC
Start: 2023-05-09 — End: ?
  Filled 2023-05-09: qty 250

## 2023-05-09 MED ORDER — SENNOSIDES-DOCUSATE SODIUM 8.6-50 MG PO TABS
2.0000 | ORAL_TABLET | Freq: Every day | ORAL | Status: DC
  Administered 2023-05-10 – 2023-05-11 (×2): 2 via ORAL
  Filled 2023-05-09 (×2): qty 2

## 2023-05-09 MED ORDER — DEXAMETHASONE SODIUM PHOSPHATE 4 MG/ML IJ SOLN
INTRAMUSCULAR | Status: AC
Start: 2023-05-09 — End: ?
  Filled 2023-05-09: qty 1

## 2023-05-09 MED ORDER — OXYTOCIN-SODIUM CHLORIDE 30-0.9 UT/500ML-% IV SOLN
2.5000 [IU]/h | INTRAVENOUS | Status: DC
  Filled 2023-05-09: qty 500

## 2023-05-09 MED ORDER — DIPHENHYDRAMINE HCL 50 MG/ML IJ SOLN: 12.5000 mg | INTRAMUSCULAR | Status: DC | PRN

## 2023-05-09 MED ORDER — LEVOTHYROXINE SODIUM 100 MCG PO TABS
200.0000 ug | ORAL_TABLET | Freq: Every day | ORAL | Status: DC
  Administered 2023-05-10 – 2023-05-11 (×2): 200 ug via ORAL
  Filled 2023-05-09 (×2): qty 2

## 2023-05-09 MED ORDER — WITCH HAZEL-GLYCERIN EX PADS: 1.0000 | MEDICATED_PAD | CUTANEOUS | Status: DC | PRN

## 2023-05-09 MED ORDER — OXYCODONE-ACETAMINOPHEN 5-325 MG PO TABS: 1.0000 | ORAL_TABLET | ORAL | Status: DC | PRN

## 2023-05-09 MED ORDER — OXYCODONE-ACETAMINOPHEN 5-325 MG PO TABS: 2.0000 | ORAL_TABLET | ORAL | Status: DC | PRN

## 2023-05-09 MED ORDER — LIDOCAINE HCL (PF) 1 % IJ SOLN
30.0000 mL | INTRAMUSCULAR | Status: AC | PRN
  Administered 2023-05-09: 30 mL via SUBCUTANEOUS
  Filled 2023-05-09: qty 30

## 2023-05-09 MED ORDER — TRANEXAMIC ACID-NACL 1000-0.7 MG/100ML-% IV SOLN
1000.0000 mg | Freq: Once | INTRAVENOUS | Status: AC
Start: 2023-05-09 — End: 2023-05-09

## 2023-05-09 MED ORDER — OXYTOCIN-SODIUM CHLORIDE 30-0.9 UT/500ML-% IV SOLN
INTRAVENOUS | Status: AC
Start: 2023-05-09 — End: ?
  Filled 2023-05-09: qty 500

## 2023-05-09 MED ORDER — FENTANYL CITRATE (PF) 100 MCG/2ML IJ SOLN
INTRAMUSCULAR | Status: AC
  Filled 2023-05-09: qty 2

## 2023-05-09 MED ORDER — OXYCODONE HCL 5 MG PO TABS: 5.0000 mg | ORAL_TABLET | ORAL | Status: DC | PRN

## 2023-05-09 MED ORDER — BENZOCAINE-MENTHOL 20-0.5 % EX AERO
1.0000 | INHALATION_SPRAY | CUTANEOUS | Status: DC | PRN
  Administered 2023-05-09: 1 via TOPICAL
  Filled 2023-05-09: qty 56

## 2023-05-09 MED ORDER — LACTATED RINGERS IV SOLN: 500.0000 mL | INTRAVENOUS | Status: DC | PRN

## 2023-05-09 MED ORDER — ZOLPIDEM TARTRATE 5 MG PO TABS: 5.0000 mg | ORAL_TABLET | Freq: Every evening | ORAL | Status: DC | PRN

## 2023-05-09 MED ORDER — FENTANYL-BUPIVACAINE-NACL 0.5-0.125-0.9 MG/250ML-% EP SOLN: 12.0000 mL/h | EPIDURAL | Status: DC | PRN

## 2023-05-09 MED ORDER — ACETAMINOPHEN 325 MG PO TABS: 650.0000 mg | ORAL_TABLET | ORAL | Status: DC | PRN

## 2023-05-09 MED ORDER — CEFAZOLIN SODIUM-DEXTROSE 1-4 GM/50ML-% IV SOLN: 1.0000 g | Freq: Three times a day (TID) | INTRAVENOUS | Status: DC

## 2023-05-09 MED ORDER — ONDANSETRON HCL 4 MG PO TABS: 4.0000 mg | ORAL_TABLET | ORAL | Status: DC | PRN

## 2023-05-09 MED ORDER — EPHEDRINE 5 MG/ML INJ: 10.0000 mg | INTRAVENOUS | Status: DC | PRN

## 2023-05-09 MED ORDER — SOD CITRATE-CITRIC ACID 500-334 MG/5ML PO SOLN
30.0000 mL | ORAL | Status: DC | PRN
  Filled 2023-05-09: qty 30

## 2023-05-09 MED ORDER — CEFAZOLIN SODIUM-DEXTROSE 2-4 GM/100ML-% IV SOLN: 2.0000 g | Freq: Once | INTRAVENOUS | Status: DC

## 2023-05-09 MED ORDER — COCONUT OIL OIL: 1.0000 | TOPICAL_OIL | Status: DC | PRN

## 2023-05-09 MED ORDER — PHENYLEPHRINE 80 MCG/ML (10ML) SYRINGE FOR IV PUSH (FOR BLOOD PRESSURE SUPPORT): 80.0000 ug | PREFILLED_SYRINGE | INTRAVENOUS | Status: DC | PRN

## 2023-05-09 MED ORDER — OXYTOCIN BOLUS FROM INFUSION
333.0000 mL | Freq: Once | INTRAVENOUS | Status: AC
  Administered 2023-05-09: 333 mL via INTRAVENOUS

## 2023-05-09 MED ORDER — LACTATED RINGERS IV SOLN: 500.0000 mL | Freq: Once | INTRAVENOUS | Status: DC

## 2023-05-09 MED ORDER — ACETAMINOPHEN 500 MG PO TABS
1000.0000 mg | ORAL_TABLET | Freq: Four times a day (QID) | ORAL | Status: DC | PRN
  Administered 2023-05-09 – 2023-05-10 (×4): 1000 mg via ORAL
  Filled 2023-05-09 (×5): qty 2

## 2023-05-09 MED ORDER — DIPHENHYDRAMINE HCL 25 MG PO CAPS: 25.0000 mg | ORAL_CAPSULE | Freq: Four times a day (QID) | ORAL | Status: DC | PRN

## 2023-05-09 MED ORDER — DIBUCAINE (PERIANAL) 1 % EX OINT: 1.0000 | TOPICAL_OINTMENT | CUTANEOUS | Status: DC | PRN

## 2023-05-09 MED ORDER — ONDANSETRON HCL 4 MG/2ML IJ SOLN: 4.0000 mg | Freq: Four times a day (QID) | INTRAMUSCULAR | Status: DC | PRN

## 2023-05-09 MED ORDER — SIMETHICONE 80 MG PO CHEW: 80.0000 mg | CHEWABLE_TABLET | ORAL | Status: DC | PRN

## 2023-05-09 MED ORDER — FENTANYL-BUPIVACAINE-NACL 0.5-0.125-0.9 MG/250ML-% EP SOLN
EPIDURAL | Status: AC
  Filled 2023-05-09: qty 250

## 2023-05-09 MED ORDER — OXYTOCIN-SODIUM CHLORIDE 30-0.9 UT/500ML-% IV SOLN: 1.0000 m[IU]/min | INTRAVENOUS | Status: DC

## 2023-05-09 MED ORDER — MORPHINE SULFATE (PF) 0.5 MG/ML IJ SOLN
INTRAMUSCULAR | Status: AC
  Filled 2023-05-09: qty 10

## 2023-05-09 MED ORDER — TERBUTALINE SULFATE 1 MG/ML IJ SOLN: 0.2500 mg | Freq: Once | INTRAMUSCULAR | Status: DC | PRN

## 2023-05-09 MED ORDER — PRENATAL MULTIVITAMIN CH
1.0000 | ORAL_TABLET | Freq: Every day | ORAL | Status: DC
  Administered 2023-05-10 – 2023-05-11 (×2): 1 via ORAL
  Filled 2023-05-09 (×2): qty 1

## 2023-05-09 MED ORDER — LACTATED RINGERS IV SOLN: INTRAVENOUS | Status: DC

## 2023-05-09 MED ORDER — ONDANSETRON HCL 4 MG/2ML IJ SOLN: 4.0000 mg | INTRAMUSCULAR | Status: DC | PRN

## 2023-05-09 MED ORDER — FENTANYL CITRATE (PF) 100 MCG/2ML IJ SOLN: 50.0000 ug | INTRAMUSCULAR | Status: DC | PRN

## 2023-05-09 MED ORDER — TRANEXAMIC ACID-NACL 1000-0.7 MG/100ML-% IV SOLN
INTRAVENOUS | Status: AC
  Administered 2023-05-09: 1000 mg via INTRAVENOUS
  Filled 2023-05-09: qty 100

## 2023-05-09 MED ORDER — ONDANSETRON HCL 4 MG/2ML IJ SOLN
INTRAMUSCULAR | Status: AC
Start: 2023-05-09 — End: ?
  Filled 2023-05-09: qty 2

## 2023-05-09 NOTE — Lactation Note (Signed)
This note was copied from a baby's chart. Lactation Consultation Note  Patient Name: Emily Houston ZOXWR'U Date: 05/09/2023 Age:40 hours Reason for consult: Follow-up assessment;Mother's request;Difficult latch;Term.See MOB MR- Hx PCOS, hypothyroidism and Gastric Sleeve.   P2, term female infant, infant had one void since birth, LC discussed infant's input and output. MOB want assistance with latch, infant latched on MOB, left breast using the football hold position, after few attempts, infant latch and sustained her latch, breastfeeding for 10 minutes. Afterwards MOB supplemented infant with formula, infant was being bottle feed when LC left the room. LC discussed importance of maternal rest, diet and hydration. MOB knows to call RN/LC for further latch assistance if needed. MOB made aware of O/P services, breastfeeding support groups, community resources, and our phone # for post-discharge questions.    Today's current feeding plan:  1- MOB BF infant by cues, on demand, every 2-3 hours, skin to skin. 2- MOB will call RN/LC for further latch assistance if needed. 1- MOB will latch infant 1st for every feeding to help stimulate and establish milk supply will offer formula afterwards ( her choice). MOB has handout "Supplementing with Breastfeeding".  Maternal Data Does the patient have breastfeeding experience prior to this delivery?: Yes How long did the patient breastfeed?: Briefly latch 1st child had latch difficultes and no BF support delivered 1st child in [place]  Feeding Mother's Current Feeding Choice: Breast Milk and Formula Nipple Type: Slow - flow  LATCH Score Latch: Repeated attempts needed to sustain latch, nipple held in mouth throughout feeding, stimulation needed to elicit sucking reflex.  Audible Swallowing: A few with stimulation  Type of Nipple: Everted at rest and after stimulation  Comfort (Breast/Nipple): Soft / non-tender  Hold (Positioning): Assistance  needed to correctly position infant at breast and maintain latch.  LATCH Score: 7   Lactation Tools Discussed/Used    Interventions Interventions: Breast feeding basics reviewed;Assisted with latch;Skin to skin;Adjust position;Breast compression;Support pillows;Position options;Education;LC Services brochure  Discharge Pump: DEBP;Personal  Consult Status Consult Status: Follow-up Date: 05/10/23 Follow-up type: In-patient    Frederico Hamman 05/09/2023, 7:36 PM

## 2023-05-09 NOTE — Lactation Note (Signed)
This note was copied from a baby's chart. Lactation Consultation Note  Patient Name: Emily Houston ZOXWR'U Date: 05/09/2023 Age:40 hours Reason for consult: Initial assessment  P2, Mother states she plans to breast and formula feed.  Reviewed hand expression and attempted latching.  Baby spitty/grunting.  Placed baby back upright on mother's chest.  RN aware.  Discussed attempting again later once infant has stabilized.  Feed on demand with cues.  Goal 8-12+ times per day after first 24 hrs.  Place baby STS if not cueing.    Maternal Data Has patient been taught Hand Expression?: Yes Does the patient have breastfeeding experience prior to this delivery?: Yes How long did the patient breastfeed?:  (Breast and formula)  Feeding Mother's Current Feeding Choice: Breast Milk and Formula   Interventions Interventions: Assisted with latch;Skin to skin;Hand express;Support pillows;Education;LC Services brochure  Consult Status Consult Status: Follow-up Date: 05/09/23 Follow-up type: In-patient  Emily Houston [facility] 05/09/2023, 2:57 PM

## 2023-05-09 NOTE — Anesthesia Preprocedure Evaluation (Signed)
Anesthesia Evaluation  Patient identified by MRN, date of birth, ID band Patient awake    Reviewed: Allergy & Precautions, NPO status , Patient's Chart, lab work & pertinent test results  Airway Mallampati: II  TM Distance: >3 FB Neck ROM: Full    Dental  (+) Dental Advisory Given   Pulmonary sleep apnea    breath sounds clear to auscultation       Cardiovascular negative cardio ROS  Rhythm:Regular Rate:Normal     Neuro/Psych negative neurological ROS     GI/Hepatic Neg liver ROS,GERD  ,,S/p gastric bypass    Endo/Other  Hypothyroidism    Renal/GU negative Renal ROS     Musculoskeletal   Abdominal   Peds  Hematology  (+) Blood dyscrasia, anemia   Anesthesia Other Findings   Reproductive/Obstetrics (+) Pregnancy                             Anesthesia Physical Anesthesia Plan  ASA: 3 and emergent  Anesthesia Plan: Spinal   Post-op Pain Management:    Induction:   PONV Risk Score and Plan: 2 and Dexamethasone, Ondansetron and Treatment may vary due to age or medical condition  Airway Management Planned: Natural Airway  Additional Equipment:   Intra-op Plan:   Post-operative Plan:   Informed Consent: I have reviewed the patients History and Physical, chart, labs and discussed the procedure including the risks, benefits and alternatives for the proposed anesthesia with the patient or authorized representative who has indicated his/her understanding and acceptance.       Plan Discussed with:   Anesthesia Plan Comments:        Anesthesia Quick Evaluation

## 2023-05-09 NOTE — MAU Note (Signed)
Emily Houston is a 42 y.o. at [redacted]w[redacted]d here in MAU reporting: she's here for labor, reports ctxs are 1-2 minutes apart since 0815.  Reports light VB and is leaking a "yellowish" fluid @ 0715.  Reports hasn't felt any FM this morning LMP: NA Onset of complaint: 0715 today Pain score: 10 Vitals:   05/09/23 0931  BP: 114/64  Pulse: 66  Resp: 18  Temp: (!) 97.4 F (36.3 C)  SpO2: 100%     FHT:128 bpm Lab orders placed from triage:   None

## 2023-05-09 NOTE — H&P (Signed)
Emily Houston is a 42 y.o. female G2P1001 @ 26.2 (EDD: 05/14/2023) presenting following spontaneous rupture of membranes in latent labor. Endorses blood-tinged leakage of fluid, painful contractions. Normal fetal movement.   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 Initially reports PCN allergy, but when asked reports childhood allergy with PCN since that time without reaction - Fetal dysrhythmia  OB History     Gravida  2   Para  1   Term  1   Preterm      AB      Living  1      SAB      IAB      Ectopic      Multiple      Live Births  1          Past Medical History:  Diagnosis Date   Abnormal uterine bleeding    Allergy    Anxiety    Cholecystitis    chronic   Depression    Dysmenorrhea    Fibroid    GERD (gastroesophageal reflux disease)    H/O seasonal allergies    Hormone disorder    Hypothyroidism    Morbid obesity (HCC)    PCOS (polycystic ovarian syndrome)    Sleep apnea    wears CPAP   Thyroid disease    Past Surgical History:  Procedure Laterality Date   CESAREAN SECTION     CHOLECYSTECTOMY N/A 03/15/2016   Procedure: LAPAROSCOPIC CHOLECYSTECTOMY;  Surgeon: Berna Bue, MD;  Location: [facility] OR;  Service: General;  Laterality: N/A;   CHOLECYSTECTOMY     gastric bypass  10/2021   THYROIDECTOMY  06/2020   Family History: family history includes Diabetes in her father; Heart disease in her maternal grandfather and another family member; Hyperlipidemia in her maternal grandfather; Hypertension in her father. Social History:  reports that she has never smoked. She has never used smokeless tobacco. She reports that she does not drink alcohol and does not use drugs.     Maternal Diabetes: No Genetic Screening: Normal Maternal Ultrasounds/Referrals:  Normal Fetal Ultrasounds or other Referrals:  None Maternal Substance Abuse:  No Significant Maternal Medications:  Meds include: Other:  Significant Maternal Lab Results:  Other: levothyroxine Number of Prenatal Visits:greater than 3 verified prenatal visits Maternal Vaccinations:RSV: Given during pregnancy >/=14 days ago and TDap Other Comments:  None  Review of Systems  Constitutional:  Negative for chills and fever.  Respiratory:  Negative for shortness of breath.   Cardiovascular:  Negative for chest pain.  Gastrointestinal:  Positive for abdominal pain.  Genitourinary:  Positive for pelvic pain, vaginal bleeding and vaginal discharge.  Neurological:  Negative for light-headedness and headaches.   History Dilation: 1.5 Effacement (%): 60 Station: -3 Exam by:: Arther Abbott RN Blood pressure 119/83, pulse 78, temperature 98.6 F (37 C), temperature source Oral, resp. rate 18, height 5\' 6"  (1.676 m), weight 86.4 kg, SpO2 100%. Exam Physical Exam Constitutional:      Appearance: Normal appearance. She is obese.  HENT:     Head: Normocephalic and atraumatic.  Eyes:     Extraocular Movements: Extraocular movements intact.  Cardiovascular:     Heart sounds: Normal heart sounds.  Pulmonary:     Comments: Labored breathing Abdominal:     Palpations: Abdomen is soft.  Tenderness: There is abdominal tenderness.  Musculoskeletal:        General: No swelling.  Skin:    General: Skin is warm and dry.  Neurological:     General: No focal deficit present.     Mental Status: Mental status is at baseline.  Psychiatric:        Mood and Affect: Mood normal.        Behavior: Behavior normal.     Prenatal labs: ABO, Rh: --/--/A POS (09/25 1306) Antibody: NEG (09/25 1306) Rubella: Immune (04/10 0000) RPR: Nonreactive (04/10 0000)  HBsAg: Negative (04/10 0000)  HIV: Non-reactive (04/10 0000)  GBS:     Assessment/Plan: 42 yo G2P1001 @ 39.2 with history of previous cesarean  section x 1 presents following SROM in latent labor. She previously planned to [facility] but after discussion today, she now desires [facility]. R/B reviewed with patient. Risks including but not limited to bleeding, infection,injury to bowel, bladder vasculature, or nerves.    Junius Creamer 05/09/2023, 11:34 AM

## 2023-05-10 LAB — CBC
HCT: 25 % — ABNORMAL LOW (ref 36.0–46.0)
Hemoglobin: 7.8 g/dL — ABNORMAL LOW (ref 12.0–15.0)
MCH: 23.7 pg — ABNORMAL LOW (ref 26.0–34.0)
MCHC: 31.2 g/dL (ref 30.0–36.0)
MCV: 76 fL — ABNORMAL LOW (ref 80.0–100.0)
Platelets: 182 10*3/uL (ref 150–400)
RBC: 3.29 MIL/uL — ABNORMAL LOW (ref 3.87–5.11)
RDW: 22.4 % — ABNORMAL HIGH (ref 11.5–15.5)
WBC: 17.4 10*3/uL — ABNORMAL HIGH (ref 4.0–10.5)
nRBC: 0 % (ref 0.0–0.2)

## 2023-05-10 LAB — RPR: RPR Ser Ql: NONREACTIVE

## 2023-05-10 MED ORDER — KETOROLAC TROMETHAMINE 30 MG/ML IJ SOLN: 30.0000 mg | Freq: Four times a day (QID) | INTRAMUSCULAR | Status: DC | PRN

## 2023-05-10 MED ORDER — ACETAMINOPHEN 500 MG PO TABS
1000.0000 mg | ORAL_TABLET | Freq: Four times a day (QID) | ORAL | Status: DC
  Administered 2023-05-10 – 2023-05-11 (×3): 1000 mg via ORAL
  Filled 2023-05-10 (×3): qty 2

## 2023-05-10 MED ORDER — DIPHENHYDRAMINE HCL 25 MG PO CAPS: 25.0000 mg | ORAL_CAPSULE | ORAL | Status: DC | PRN

## 2023-05-10 MED ORDER — NALOXONE HCL 0.4 MG/ML IJ SOLN: 0.4000 mg | INTRAMUSCULAR | Status: DC | PRN

## 2023-05-10 MED ORDER — NALOXONE HCL 4 MG/10ML IJ SOLN: 1.0000 ug/kg/h | INTRAVENOUS | Status: DC | PRN

## 2023-05-10 MED ORDER — SODIUM CHLORIDE 0.9% FLUSH: 3.0000 mL | INTRAVENOUS | Status: DC | PRN

## 2023-05-10 MED ORDER — DIPHENHYDRAMINE HCL 50 MG/ML IJ SOLN: 12.5000 mg | INTRAMUSCULAR | Status: DC | PRN

## 2023-05-10 MED ORDER — SCOPOLAMINE 1 MG/3DAYS TD PT72: 1.0000 | MEDICATED_PATCH | Freq: Once | TRANSDERMAL | Status: DC

## 2023-05-10 MED ORDER — ONDANSETRON HCL 4 MG/2ML IJ SOLN: 4.0000 mg | Freq: Three times a day (TID) | INTRAMUSCULAR | Status: DC | PRN

## 2023-05-10 NOTE — Progress Notes (Signed)
Patient is doing well.  She is ambulating, voiding, tolerating PO.  Pain control is good.  Lochia is appropriate  Vitals:   05/09/23 1558 05/09/23 2022 05/10/23 0000 05/10/23 0519  BP: 111/74 (!) 96/55 (!) 89/65 95/61  Pulse: 72 71 82 70  Resp: 16 18 17 16   Temp: 99.5 F (37.5 C) 98.9 F (37.2 C) 98.7 F (37.1 C) 98.2 F (36.8 C)  TempSrc: Oral Oral  Oral  SpO2: 100%     Weight:      Height:        NAD Fundus firm Ext: no edema  Lab Results  Component Value Date   WBC 17.4 (H) 05/10/2023   HGB 7.8 (L) 05/10/2023   HCT 25.0 (L) 05/10/2023   MCV 76.0 (L) 05/10/2023   PLT 182 05/10/2023    --/--/A POS (10/22 1133)  A/P 42 y.o. [id number] PPD#1 s/p VBAC. Routine care.   History of anemia related to iron deficiency.  S/p IV iron x 2 doses (1 and 2 weeks ago).  With recent IV iron, will not repeat today and plan to repeat CBC in the postpartum visit Expect d/c to home tomorrow due to rapid labor / delivery and inadequate GBS prophylaxis    Dasean Brow GEFFEL Estell Dillinger

## 2023-05-10 NOTE — Lactation Note (Signed)
This note was copied from a baby's chart. Lactation Consultation Note  Patient Name: Emily Houston ZOXWR'U Date: 05/10/2023 Age:40 hours Reason for consult: Follow-up assessment  P2, Baby recently breastfed for 20 min and then was supplemented with formula.  Mother is happy since she struggled with breastfeeding her first child. Reviewed Cluster feeding and suggest calling for help tonight has needed.    Maternal Data Has patient been taught Hand Expression?: Yes  Feeding Mother's Current Feeding Choice: Breast Milk and Formula Nipple Type: Slow - flow  Interventions Interventions: Education;Breast feeding basics reviewed Consult Status Consult Status: Follow-up Date: 05/11/23 Follow-up type: In-patient    [name] [facility] 05/10/2023, 1:51 PM

## 2023-05-10 NOTE — Social Work (Signed)
CSW received consult for hx of Postpartum Depression.  CSW met with MOB to offer support and complete assessment. CSW entered the room and observed MOB walking around the room holding the infant. CSW introduced self, CSW role and reason for visit, MOB was agreeable to visit. CSW inquired about how MOB was feeling, MOB reported feeling good. CSW inquired about MOB MH hx, MOB reported she she experienced PPD after the birth of her first child, MOB reported feeling, "moody and down". CSW inquired about MOB's mood during this pregnancy, MOB reported having a stable mood. MOB reported some stress at the beginning of pregnancy with trying to prepare for baby's arrival. CSW verbalized understanding. CSW assessed for safety, MOB denied any SI or HI. CSW provided education regarding the baby blues period vs. perinatal mood disorders, discussed treatment and gave resources for mental health follow up if concerns arise.  CSW recommends self-evaluation during the postpartum time period using the New Mom Checklist from Postpartum Progress and encouraged MOB to contact a medical professional if symptoms are noted at any time. MOB identified FOB and her sister as her supports.   CSW provided review of Sudden Infant Death Syndrome (SIDS) precautions.  MOB identified [facility] for infants follow up care. MOB reported she has all necessary items for the infant including a bassinet and car seat.  CSW identifies no further need for intervention and no barriers to discharge at this time.  Wende Neighbors, LCSWA Clinical Social Worker [phone]

## 2023-05-11 ENCOUNTER — Ambulatory Visit (HOSPITAL_COMMUNITY): Payer: Self-pay

## 2023-05-11 LAB — SURGICAL PATHOLOGY

## 2023-05-11 MED ORDER — SODIUM CHLORIDE 0.9 % IV SOLN
100.0000 mg | Freq: Once | INTRAVENOUS | Status: AC
  Administered 2023-05-11: 100 mg via INTRAVENOUS
  Filled 2023-05-11: qty 5

## 2023-05-11 MED ORDER — ACETAMINOPHEN 500 MG PO TABS: 500.0000 mg | ORAL_TABLET | Freq: Four times a day (QID) | ORAL | Status: DC

## 2023-05-11 MED ORDER — CYCLOBENZAPRINE HCL 5 MG PO TABS
5.0000 mg | ORAL_TABLET | Freq: Three times a day (TID) | ORAL | Status: DC
  Administered 2023-05-11: 5 mg via ORAL
  Filled 2023-05-11: qty 1

## 2023-05-11 NOTE — Plan of Care (Signed)
Adequate for discharge.

## 2023-05-11 NOTE — Discharge Summary (Signed)
Postpartum Discharge Summary  Date of Service updated 10/22-10/24/24     Patient Name: Emily Houston DOB: 06-Mar-1981 MRN: [id number]  Date of admission: 05/09/2023 Delivery date:05/09/2023 Delivering provider: Truett Perna A Date of discharge: 05/11/2023  Admitting diagnosis: Indication for care in labor or delivery [O75.9] Intrauterine pregnancy: [redacted]w[redacted]d     Secondary diagnosis:  Principal Problem:   Indication for care in labor or delivery Active Problems:   VBAC (vaginal birth after Cesarean)  Additional problems: iron deficiency anemia, h/o PPD    Discharge diagnosis: VBAC and Anemia                                              Post partum procedures: postpartum iron transfusion Augmentation:  none Complications: None  Hospital course: Onset of Labor With Vaginal Delivery      42 y.o. yo [id number] at [redacted]w[redacted]d was admitted in Latent Labor on 05/09/2023. Labor course was complicated by history of prior  C-section and rapid progression of labor. Membrane Rupture Time/Date: 7:10 AM,05/09/2023  Delivery Method:VBAC, Spontaneous Operative Delivery:N/A Episiotomy: None Lacerations:  1st degree;Periurethral;Labial Patient had a postpartum course complicated by iron deficiency anemia.  She is ambulating, tolerating a regular diet, passing flatus, and urinating well. Patient is discharged home in stable condition on 05/11/23.  Newborn Data: Birth date:05/09/2023 Birth time:12:32 PM Gender:Female Living status:Living Apgars:8 ,9  Weight:2810 g  Magnesium Sulfate received: No BMZ received: No Rhophylac:No MMR:No T-DaP:Given prenatally Flu: Yes RSV Vaccine received: No Transfusion:No Immunizations administered: Immunization History  Administered Date(s) Administered   Moderna Sars-Covid-2 Vaccination 02/27/2020, 03/26/2020    Physical exam  Vitals:   05/10/23 0519 05/10/23 1216 05/10/23 2053 05/11/23 0515  BP: 95/61 104/72 99/73 103/71  Pulse: 70 72 66 73   Resp: 16 17 17 18   Temp: 98.2 F (36.8 C) 97.7 F (36.5 C) 98.4 F (36.9 C) 98.2 F (36.8 C)  TempSrc: Oral Oral Axillary Oral  SpO2:  100% 100% 100%  Weight:      Height:       General: alert, cooperative, and no distress Lochia: appropriate Uterine Fundus: firm Incision: N/A DVT Evaluation: No evidence of DVT seen on physical exam. Labs: Lab Results  Component Value Date   WBC 17.4 (H) 05/10/2023   HGB 7.8 (L) 05/10/2023   HCT 25.0 (L) 05/10/2023   MCV 76.0 (L) 05/10/2023   PLT 182 05/10/2023      Latest Ref Rng & Units 05/09/2023   10:43 AM  CMP  Glucose 70 - 99 mg/dL 89   BUN 6 - 20 mg/dL 5   Creatinine 1.47 - 8.29 mg/dL 5.62   Sodium 130 - 865 mmol/L 137   Potassium 3.5 - 5.1 mmol/L 3.3   Chloride 98 - 111 mmol/L 107   CO2 22 - 32 mmol/L 19   Calcium 8.9 - 10.3 mg/dL 7.4   Total Protein 6.5 - 8.1 g/dL 6.3   Total Bilirubin 0.3 - 1.2 mg/dL 0.8   Alkaline Phos 38 - 126 U/L 141   AST 15 - 41 U/L 30   ALT 0 - 44 U/L 15    Edinburgh Score:    05/10/2023   10:40 PM  Edinburgh Postnatal Depression Scale Screening Tool  I have been able to laugh and see the funny side of things. --      After visit meds:  Allergies as of 05/11/2023       Reactions   Aspirin Other (See Comments)   "Dizziness."         Medication List     STOP taking these medications    CALCIUM PO   clotrimazole-betamethasone cream Commonly known as: LOTRISONE   PRENATAL PO       TAKE these medications    ACCRUFeR 30 MG Caps Generic drug: Ferric Maltol Take 30 mg by mouth 2 (two) times daily.   acetaminophen 500 MG tablet Commonly known as: TYLENOL Take 1 tablet (500 mg total) by mouth every 6 (six) hours.   calcitRIOL 0.5 MCG capsule Commonly known as: ROCALTROL Take 1 mcg by mouth daily.   levothyroxine 200 MCG tablet Commonly known as: SYNTHROID Take 200 mcg by mouth daily before breakfast.         Discharge home in stable condition Infant Feeding:  Breast Infant Disposition:NICU Discharge instruction: per After Visit Summary and Postpartum booklet. Activity: Advance as tolerated. Pelvic rest for 6 weeks.  Diet: routine diet Anticipated Birth Control: Unsure Postpartum Appointment:4 weeks Additional Postpartum F/U: Postpartum Depression checkup and CBC for anemia Future Appointments:Patient to call. Follow up Visit:  Follow-up Information     [facility]. Schedule an appointment as soon as possible for a visit in 4 week(s).   Contact information: [address]  [address] [place] [address] [phone]                     05/11/2023 Willa Frater, MD

## 2023-05-11 NOTE — Lactation Note (Signed)
This note was copied from a baby's chart.  NICU Lactation Consultation Note  Patient Name: Emily Houston ZOXWR'U Date: 05/11/2023 Age:40 hours  Reason for consult: Follow-up assessment; Other (Comment); NICU baby; Term; Maternal discharge; Maternal endocrine disorder (Transfer from [place], [place], Telephone call) Type of Endocrine Disorder?: Thyroid (Thyroidectomy (synthroid))  SUBJECTIVE LC in the room to visit with family but Emily Houston has already left to go home, she was discharged from the [facility] today. Spoke with Emily Houston over the phone and she had some questions regarding her home pump. She's amenable to keep baby on Similac 20 calorie formula while in the NICU since that was her feeding choice on admission on the first place, to take baby to breast and also supplement with bottles. She'll be back to the NICU tomorrow and would like to meet with lactation, this LC will meet with family in person tomorrow morning. She'll be using her hand pump in the meantime, will revise feeding and pumping plan tomorrow. All questions and concerns answered, family to meet with NICU LC on 05/12/2023.  OBJECTIVE Infant data: Mother's Current Feeding Choice: Breast Milk and Formula  O2 Device: Room Air  Infant feeding assessment Scale for Readiness: 2 Scale for Quality: 2   Maternal data: [id number] VBAC, Spontaneous Has patient been taught Hand Expression?: Yes Current breast feeding challenges:: NICU admission Pumping frequency: q 3 hours (recommended) Pumped volume: 0 mL Risk factor for low/delayed milk supply:: infant separation  Pump: Personal (Motif DEBP)  ASSESSMENT Infant: Feeding Status: Ad lib Feeding method: Bottle Nipple Type: Nfant Slow Flow (purple)  Maternal: Milk volume: Normal  INTERVENTIONS/PLAN Interventions: Interventions: Breast feeding basics reviewed; Education Tools: Pump Pump Education: Setup, frequency, and cleaning; Milk Storage  Plan: Consult Status: NICU  follow-up NICU Follow-up type: Verify onset of copious milk; Verify absence of engorgement   Emily Houston S Emily Houston 05/11/2023, 7:02 PM

## 2023-05-11 NOTE — Progress Notes (Signed)
Post Partum Day 2 s/p VBAC Subjective: up ad lib, voiding, and tolerating PO. Describes full body muscle aches from delivering without an epidural, says that tylenol is not helping. Denies fever or chills, CP, SOB, lightheadness or dizziness. Baby was admitted to NICU overnight for respiratory issues per pt.  Objective: Blood pressure 103/71, pulse 73, temperature 98.2 F (36.8 C), temperature source Oral, resp. rate 18, height 5\' 6"  (1.676 m), weight 86.4 kg, SpO2 100%, unknown if currently breastfeeding. Vitals:   05/10/23 2053 05/11/23 0515  BP: 99/73 103/71  Pulse: 66 73  Resp: 17 18  Temp: 98.4 F (36.9 C) 98.2 F (36.8 C)  SpO2: 100% 100%    Physical Exam:  General: alert, cooperative, appears stated age, and no distress Lochia: appropriate Uterine Fundus: firm DVT Evaluation: No evidence of DVT seen on physical exam.  Recent Labs    05/09/23 1043 05/10/23 0500  HGB 9.7* 7.8*  HCT 31.4* 25.0*    Assessment/Plan: 42 y.o. [id number] PPD#2 s/p VBAC. Routine care.   History of anemia related to iron deficiency.  S/p antepartum IV iron x 2 doses (1 and 2 weeks ago). Hgb trend above. Offered iron transfusion prior to discharge, patient accepts. Plan to repeat CBC in the postpartum visit H/o PPD: mood is currently good. Seen by SW while inpatient, no barriers to discharge. Follow up at postpartum visit.  Dispo: Discharge home after iron infusion.      LOS: 2 days   Willa Frater, MD 05/11/2023, 7:35 AM

## 2023-05-12 ENCOUNTER — Inpatient Hospital Stay (HOSPITAL_COMMUNITY): Payer: No Typology Code available for payment source

## 2023-05-12 ENCOUNTER — Inpatient Hospital Stay (HOSPITAL_COMMUNITY)
Admission: AD | Admit: 2023-05-12 | Payer: No Typology Code available for payment source | Source: Home / Self Care | Admitting: Obstetrics and Gynecology

## 2023-05-12 ENCOUNTER — Ambulatory Visit (HOSPITAL_COMMUNITY): Payer: Self-pay

## 2023-05-12 NOTE — Lactation Note (Signed)
This note was copied from a baby's chart.  NICU Lactation Consultation Note  Patient Name: Emily Houston [id number] Date: 05/12/2023 Age:40 hours  Reason for consult: Follow-up assessment; Other (Comment); NICU baby; Term; Maternal endocrine disorder (Transfer from [place], [place]) Type of Endocrine Disorder?: Thyroid (thyrodectomy (synthroid))  SUBJECTIVE Visited with family of 29 hours old FT NICU female; Ms. Liao is a P2 and reports she hasn't pumped since she got discharged yesterday. She did bring the hand pump she was given at the hospital (it was unopened). Resized her flanges to a # 21 and let her know that there are inserts available in that size for her home pump since the Medela flanges are not compatible with the Motif one. Parents are taking baby "Emily Houston" home today. Reviewed discharge education and the importance of consistent pumping for the onset of lactogenesis II and the prevention of engorgement. Her plan is to do both, direct breastfeeding along with pumping and bottle feeding with EBM/formula. Encouraged to pump whenever baby is getting a bottle to protect her supply. FOB present. All questions and concerns answered, family to contact [facility] services PRN.  OBJECTIVE Infant data: Mother's Current Feeding Choice: Breast Milk and Formula  O2 Device: Room Air  Infant feeding assessment Scale for Readiness: 1 Scale for Quality: 2   Maternal data: G2P2002 VBAC, Spontaneous Significant Breast History:: Her nipples are more everted this time, unlike with her first baby, they were "flat" Current breast feeding challenges:: NICU admission Pumping frequency: She has not pumped that last 24 hours Pumped volume: 0 mL Flange Size: 21 Risk factor for low/delayed milk supply:: infant separation  Pump: Personal (Motif DEBP)  ASSESSMENT Infant: Feeding Status: Ad lib Feeding method: Bottle Nipple Type: Dr. Irving Burton Preemie (collapsing purple nipple)  Maternal: Milk volume:  Normal  INTERVENTIONS/PLAN Interventions: Interventions: Breast feeding basics reviewed; Hand pump; Education Discharge Education: Engorgement and breast care Tools: Pump; Flanges Pump Education: Setup, frequency, and cleaning; Milk Storage  Plan: Consult Status: Complete NICU Follow-up type: Verify onset of copious milk; Verify absence of engorgement   Aum Caggiano S Christabelle Hanzlik 05/12/2023, 11:10 AM

## 2023-05-30 ENCOUNTER — Telehealth (HOSPITAL_COMMUNITY): Payer: Self-pay

## 2023-05-30 NOTE — Telephone Encounter (Signed)
05/30/2023 1326  Name: Emily Houston MRN: [id number] DOB: Jan 18, 1981  Reason for Call:  Transition of Care Hospital Discharge Call  Contact Status: Patient Contact Status: Message  Language assistant needed: Interpreter Mode: Interpreter Not Needed        Follow-Up Questions:    Inocente Salles Postnatal Depression Scale:  In the Past 7 Days:    PHQ2-9 Depression Scale:     Discharge Follow-up:    Post-discharge interventions: NA  Signature  Signe Colt

## 2023-07-05 ENCOUNTER — Encounter (HOSPITAL_BASED_OUTPATIENT_CLINIC_OR_DEPARTMENT_OTHER): Payer: Self-pay | Admitting: Student

## 2023-07-05 NOTE — Progress Notes (Signed)
Spoke w/ via phone for pre-op interview--- Emily Houston Lab needs dos---- UPT per anesthesia. Surgeon orders requested 07/05/23.        Lab results------ COVID test -----patient states asymptomatic no test needed Arrive at -------0830 NPO after MN NO Solid Food.  Clear liquids from MN until---0730 Med rec completed Medications to take morning of surgery -----Synthroid, Zoloft and Norethindrone Diabetic medication ----- Patient instructed no nail polish to be worn day of surgery Patient instructed to bring photo id and insurance card day of surgery Patient aware to have Driver (ride ) / caregiver    for 24 hours after surgery - Emily Houston Patient Special Instructions ----- Pre-Op special Instructions ----- Patient verbalized understanding of instructions that were given at this phone interview. Patient denies chest pain, sob, fever, cough at the interview.

## 2023-07-10 NOTE — H&P (Signed)
Emily Houston is an 42 y.o. female recently postpartum from a VBAC on 10/22 who presents for scheduled surgery.   She has completed her family planning and requested surgical sterilization.   She also has a longstanding history of AUB and is to undergo hysteroscopic ablation.   She had a recent EMB in the office which showed possible retained POC, HCG quant was negative at that visit. She has not had intercourse since delivery.   Pertinent Gynecological History: Menses: heavy menses Bleeding: dysfunctional uterine bleeding Contraception: abstinence DES exposure: denies Blood transfusions: none Previous GYN Procedures: None  Last mammogram: None Last pap: normal Date: 2022 OB History: G2, P2   Menstrual History: LMP: 06/16/23 No LMP recorded (lmp unknown).    Past Medical History:  Diagnosis Date   Abnormal uterine bleeding    Allergy    Anxiety    Cholecystitis    chronic   Depression    Dysmenorrhea    Fibroid    GERD (gastroesophageal reflux disease)    H/O seasonal allergies    Hormone disorder    Hypothyroidism    Morbid obesity (HCC)    PCOS (polycystic ovarian syndrome)    Sleep apnea    wears CPAP   Thyroid disease     Past Surgical History:  Procedure Laterality Date   CESAREAN SECTION     CHOLECYSTECTOMY N/A 03/15/2016   Procedure: LAPAROSCOPIC CHOLECYSTECTOMY;  Surgeon: Berna Bue, MD;  Location: [facility] OR;  Service: General;  Laterality: N/A;   CHOLECYSTECTOMY     gastric bypass  10/2021   THYROIDECTOMY  06/2020    Family History  Problem Relation Age of Onset   Hypertension Father    Diabetes Father    Heart disease Other    Hyperlipidemia Maternal Grandfather    Heart disease Maternal Grandfather     Social History:  reports that she has never smoked. She has never used smokeless tobacco. She reports that she does not drink alcohol and does not use drugs.  Allergies:  Allergies  Allergen Reactions   Aspirin Other (See  Comments)    "Dizziness."     No medications prior to admission.    Review of Systems  Constitutional:  Negative for chills and fever.  Respiratory:  Negative for chest tightness and shortness of breath.   Cardiovascular:  Negative for chest pain.  Gastrointestinal:  Negative for abdominal pain and constipation.  Genitourinary:  Positive for vaginal bleeding. Negative for pelvic pain and vaginal discharge.  Musculoskeletal:  Negative for gait problem.  Neurological:  Negative for light-headedness and headaches.    Height 5\' 5"  (1.651 m), weight 76.7 kg, not currently breastfeeding. Physical Exam Constitutional:      Appearance: Normal appearance.  HENT:     Head: Normocephalic and atraumatic.  Eyes:     Extraocular Movements: Extraocular movements intact.  Cardiovascular:     Rate and Rhythm: Normal rate.     Heart sounds: Normal heart sounds.  Abdominal:     General: There is no distension.     Palpations: Abdomen is soft.     Tenderness: There is no abdominal tenderness.  Musculoskeletal:        General: No swelling.  Skin:    General: Skin is warm and dry.  Neurological:     General: No focal deficit present.     Mental Status: She is alert. Mental status is at baseline.  Psychiatric:        Mood and Affect: Mood normal.  Behavior: Behavior normal.     No results found for this or any previous visit (from the past 24 hours).  No results found.  Assessment/Plan: 42 yo G2P2 with AUB, desired fertility, and possible retained products presents for surgery  - HCG quant prior to procedure. 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

## 2023-07-13 ENCOUNTER — Other Ambulatory Visit: Payer: Self-pay

## 2023-07-13 ENCOUNTER — Encounter (HOSPITAL_BASED_OUTPATIENT_CLINIC_OR_DEPARTMENT_OTHER)
Admission: RE | Disposition: A | Discharge status: Home / Self Care | Payer: Self-pay | Source: Home / Self Care | Attending: Student

## 2023-07-13 ENCOUNTER — Ambulatory Visit (HOSPITAL_BASED_OUTPATIENT_CLINIC_OR_DEPARTMENT_OTHER)
Admission: RE | Admit: 2023-07-13 | Discharge: 2023-07-13 | Disposition: A | Discharge status: Home / Self Care | Payer: No Typology Code available for payment source | Attending: Student | Admitting: Student

## 2023-07-13 ENCOUNTER — Encounter (HOSPITAL_BASED_OUTPATIENT_CLINIC_OR_DEPARTMENT_OTHER): Payer: Self-pay | Admitting: Student

## 2023-07-13 ENCOUNTER — Ambulatory Visit (HOSPITAL_BASED_OUTPATIENT_CLINIC_OR_DEPARTMENT_OTHER): Payer: No Typology Code available for payment source | Admitting: Anesthesiology

## 2023-07-13 DIAGNOSIS — Z302 Encounter for sterilization: Secondary | ICD-10-CM

## 2023-07-13 DIAGNOSIS — N920 Excessive and frequent menstruation with regular cycle: Secondary | ICD-10-CM

## 2023-07-13 DIAGNOSIS — Z01818 Encounter for other preprocedural examination: Secondary | ICD-10-CM

## 2023-07-13 DIAGNOSIS — G4730 Sleep apnea, unspecified: Secondary | ICD-10-CM | POA: Insufficient documentation

## 2023-07-13 DIAGNOSIS — N939 Abnormal uterine and vaginal bleeding, unspecified: Secondary | ICD-10-CM

## 2023-07-13 HISTORY — PX: DILITATION & CURRETTAGE/HYSTROSCOPY WITH NOVASURE ABLATION: SHX5568

## 2023-07-13 HISTORY — PX: LAPAROSCOPIC BILATERAL SALPINGECTOMY: SHX5889

## 2023-07-13 LAB — CBC
HCT: 38.3 % (ref 36.0–46.0)
Hemoglobin: 11.9 g/dL — ABNORMAL LOW (ref 12.0–15.0)
MCH: 26.3 pg (ref 26.0–34.0)
MCHC: 31.1 g/dL (ref 30.0–36.0)
MCV: 84.7 fL (ref 80.0–100.0)
Platelets: 201 10*3/uL (ref 150–400)
RBC: 4.52 MIL/uL (ref 3.87–5.11)
RDW: 20 % — ABNORMAL HIGH (ref 11.5–15.5)
WBC: 4.5 10*3/uL (ref 4.0–10.5)
nRBC: 0 % (ref 0.0–0.2)

## 2023-07-13 LAB — TYPE AND SCREEN
ABO/RH(D): A POS
Antibody Screen: NEGATIVE

## 2023-07-13 LAB — HCG, QUANTITATIVE, PREGNANCY: hCG, Beta Chain, Quant, S: <1 m[IU]/mL (ref <5)

## 2023-07-13 LAB — POCT PREGNANCY, URINE: Preg Test, Ur: NEGATIVE

## 2023-07-13 SURGERY — DILATATION & CURETTAGE/HYSTEROSCOPY WITH NOVASURE ABLATION
Anesthesia: General | Site: Abdomen

## 2023-07-13 MED ORDER — PHENYLEPHRINE HCL (PRESSORS) 10 MG/ML IV SOLN
INTRAVENOUS | Status: DC | PRN
  Administered 2023-07-13 (×4): 80 ug via INTRAVENOUS

## 2023-07-13 MED ORDER — EPHEDRINE SULFATE (PRESSORS) 50 MG/ML IJ SOLN
INTRAMUSCULAR | Status: DC | PRN
  Administered 2023-07-13: 5 mg via INTRAVENOUS

## 2023-07-13 MED ORDER — KETOROLAC TROMETHAMINE 30 MG/ML IJ SOLN
INTRAMUSCULAR | Status: AC
  Filled 2023-07-13: qty 2

## 2023-07-13 MED ORDER — PROPOFOL 10 MG/ML IV BOLUS
INTRAVENOUS | Status: DC | PRN
  Administered 2023-07-13: 100 mg via INTRAVENOUS

## 2023-07-13 MED ORDER — ROCURONIUM BROMIDE 100 MG/10ML IV SOLN
INTRAVENOUS | Status: DC | PRN
  Administered 2023-07-13: 60 mg via INTRAVENOUS

## 2023-07-13 MED ORDER — OXYCODONE HCL 5 MG PO TABS: 5.0000 mg | ORAL_TABLET | ORAL | 0 refills | Status: AC | PRN

## 2023-07-13 MED ORDER — ONDANSETRON HCL 4 MG/2ML IJ SOLN
INTRAMUSCULAR | Status: DC | PRN
  Administered 2023-07-13: 4 mg via INTRAVENOUS

## 2023-07-13 MED ORDER — SODIUM CHLORIDE 0.9 % IR SOLN
Status: DC | PRN
  Administered 2023-07-13: 1000 mL

## 2023-07-13 MED ORDER — SIMETHICONE 80 MG PO CHEW: 80.0000 mg | CHEWABLE_TABLET | Freq: Four times a day (QID) | ORAL | 0 refills | Status: AC | PRN

## 2023-07-13 MED ORDER — LIDOCAINE HCL 1 % IJ SOLN
INTRAMUSCULAR | Status: DC | PRN
  Administered 2023-07-13: 10 mL

## 2023-07-13 MED ORDER — SUGAMMADEX SODIUM 200 MG/2ML IV SOLN
INTRAVENOUS | Status: DC | PRN
  Administered 2023-07-13: 200 mg via INTRAVENOUS

## 2023-07-13 MED ORDER — DEXAMETHASONE SODIUM PHOSPHATE 10 MG/ML IJ SOLN
INTRAMUSCULAR | Status: AC
  Filled 2023-07-13: qty 1

## 2023-07-13 MED ORDER — OXYCODONE HCL 5 MG/5ML PO SOLN: 5.0000 mg | Freq: Once | ORAL | Status: DC | PRN

## 2023-07-13 MED ORDER — MIDAZOLAM HCL 5 MG/5ML IJ SOLN
INTRAMUSCULAR | Status: DC | PRN
  Administered 2023-07-13: 2 mg via INTRAVENOUS

## 2023-07-13 MED ORDER — MIDAZOLAM HCL 2 MG/2ML IJ SOLN
INTRAMUSCULAR | Status: AC
Start: 2023-07-13 — End: ?
  Filled 2023-07-13: qty 2

## 2023-07-13 MED ORDER — BUPIVACAINE HCL (PF) 0.25 % IJ SOLN
INTRAMUSCULAR | Status: DC | PRN
  Administered 2023-07-13: 9 mL

## 2023-07-13 MED ORDER — DROPERIDOL 2.5 MG/ML IJ SOLN
INTRAMUSCULAR | Status: DC | PRN
  Administered 2023-07-13: .625 mg via INTRAVENOUS

## 2023-07-13 MED ORDER — ONDANSETRON HCL 4 MG/2ML IJ SOLN
INTRAMUSCULAR | Status: AC
  Filled 2023-07-13: qty 2

## 2023-07-13 MED ORDER — FENTANYL CITRATE (PF) 100 MCG/2ML IJ SOLN
INTRAMUSCULAR | Status: AC
  Filled 2023-07-13: qty 2

## 2023-07-13 MED ORDER — DEXAMETHASONE SODIUM PHOSPHATE 4 MG/ML IJ SOLN
INTRAMUSCULAR | Status: DC | PRN
  Administered 2023-07-13: 4 mg via INTRAVENOUS

## 2023-07-13 MED ORDER — LIDOCAINE HCL (PF) 2 % IJ SOLN
INTRAMUSCULAR | Status: AC
  Filled 2023-07-13: qty 5

## 2023-07-13 MED ORDER — ACETAMINOPHEN 500 MG PO TABS: 1000.0000 mg | ORAL_TABLET | Freq: Three times a day (TID) | ORAL | 0 refills | Status: AC

## 2023-07-13 MED ORDER — ACETAMINOPHEN 500 MG PO TABS
1000.0000 mg | ORAL_TABLET | ORAL | Status: AC
  Administered 2023-07-13: 1000 mg via ORAL

## 2023-07-13 MED ORDER — SODIUM CHLORIDE 0.9 % IV SOLN: INTRAVENOUS | Status: DC

## 2023-07-13 MED ORDER — FENTANYL CITRATE (PF) 100 MCG/2ML IJ SOLN: 25.0000 ug | INTRAMUSCULAR | Status: DC | PRN

## 2023-07-13 MED ORDER — LIDOCAINE HCL (CARDIAC) PF 100 MG/5ML IV SOSY
PREFILLED_SYRINGE | INTRAVENOUS | Status: DC | PRN
  Administered 2023-07-13: 100 mg via INTRAVENOUS

## 2023-07-13 MED ORDER — LACTATED RINGERS IV SOLN: INTRAVENOUS | Status: DC

## 2023-07-13 MED ORDER — ACETAMINOPHEN 500 MG PO TABS
ORAL_TABLET | ORAL | Status: AC
  Filled 2023-07-13: qty 2

## 2023-07-13 MED ORDER — OXYCODONE HCL 5 MG PO TABS: 5.0000 mg | ORAL_TABLET | Freq: Once | ORAL | Status: DC | PRN

## 2023-07-13 MED ORDER — FENTANYL CITRATE (PF) 100 MCG/2ML IJ SOLN
INTRAMUSCULAR | Status: DC | PRN
  Administered 2023-07-13 (×2): 50 ug via INTRAVENOUS

## 2023-07-13 MED ORDER — LACTATED RINGERS IV SOLN: INTRAVENOUS | Status: DC | PRN

## 2023-07-13 MED ORDER — ONDANSETRON HCL 4 MG/2ML IJ SOLN: 4.0000 mg | Freq: Once | INTRAMUSCULAR | Status: DC | PRN

## 2023-07-13 SURGICAL SUPPLY — 50 items
ABLATOR SURESOUND NOVASURE (ABLATOR) IMPLANT
CABLE HIGH FREQUENCY MONO STRZ (ELECTRODE) IMPLANT
CATH ROBINSON RED A/P 16FR (CATHETERS) ×2 IMPLANT
COVER MAYO STAND STRL (DRAPES) ×2 IMPLANT
DERMABOND ADVANCED .7 DNX12 (GAUZE/BANDAGES/DRESSINGS) ×2 IMPLANT
DRAPE SURG IRRIG POUCH 19X23 (DRAPES) ×2 IMPLANT
DRSG OPSITE POSTOP 3X4 (GAUZE/BANDAGES/DRESSINGS) IMPLANT
DRSG TELFA 3X8 NADH STRL (GAUZE/BANDAGES/DRESSINGS) ×2 IMPLANT
DURAPREP 26ML APPLICATOR (WOUND CARE) ×2 IMPLANT
GAUZE 4X4 16PLY ~~LOC~~+RFID DBL (SPONGE) ×4 IMPLANT
GLOVE BIO SURGEON STRL SZ 6.5 (GLOVE) ×2 IMPLANT
GLOVE BIOGEL PI IND STRL 6 (GLOVE) ×2 IMPLANT
GLOVE SS PI 5.5 STRL (GLOVE) ×2 IMPLANT
GOWN STRL REUS W/TWL LRG LVL3 (GOWN DISPOSABLE) ×6 IMPLANT
IRRIG SUCT STRYKERFLOW 2 WTIP (MISCELLANEOUS) ×2 IMPLANT
IRRIGATION SUCT STRKRFLW 2 WTP (MISCELLANEOUS) ×2 IMPLANT
IV NS IRRIG 3000ML ARTHROMATIC (IV SOLUTION) ×2 IMPLANT
KIT PINK PAD W/HEAD ARE REST (MISCELLANEOUS) ×2 IMPLANT
KIT PINK PAD W/HEAD ARM REST (MISCELLANEOUS) ×2 IMPLANT
KIT PROCEDURE FLUENT (KITS) ×2 IMPLANT
KIT TURNOVER CYSTO (KITS) ×2 IMPLANT
LIGASURE VESSEL 5MM BLUNT TIP (ELECTROSURGICAL) IMPLANT
MANIPULATOR UTERINE 7CM CLEARV (MISCELLANEOUS) ×2 IMPLANT
NDL SPNL 18GX3.5 QUINCKE PK (NEEDLE) IMPLANT
NEEDLE SPNL 18GX3.5 QUINCKE PK (NEEDLE) IMPLANT
NS IRRIG 1000ML POUR BTL (IV SOLUTION) ×2 IMPLANT
PACK LAPAROSCOPY BASIN (CUSTOM PROCEDURE TRAY) ×2 IMPLANT
PACK VAGINAL MINOR WOMEN LF (CUSTOM PROCEDURE TRAY) ×2 IMPLANT
PAD OB MATERNITY 11 LF (PERSONAL CARE ITEMS) ×2 IMPLANT
PROTECTOR NERVE ULNAR (MISCELLANEOUS) ×4 IMPLANT
SEAL CERVICAL OMNI LOK (ABLATOR) IMPLANT
SEAL ROD LENS SCOPE MYOSURE (ABLATOR) ×2 IMPLANT
SET TUBE SMOKE EVAC HIGH FLOW (TUBING) ×2 IMPLANT
SHEARS HARMONIC 36 ACE (MISCELLANEOUS) IMPLANT
SLEEVE SCD COMPRESS KNEE MED (STOCKING) ×2 IMPLANT
SLEEVE Z-THREAD 5X100MM (TROCAR) ×2 IMPLANT
SOL ELECTROSURG ANTI STICK (MISCELLANEOUS) IMPLANT
SOLUTION ELECTROSURG ANTI STCK (MISCELLANEOUS) IMPLANT
SPIKE FLUID TRANSFER (MISCELLANEOUS) ×2 IMPLANT
SUT VIC AB 2-0 SH 27XBRD (SUTURE) IMPLANT
SUT VIC AB 3-0 PS2 18XBRD (SUTURE) ×2 IMPLANT
SUT VICRYL 0 UR6 27IN ABS (SUTURE) IMPLANT
SYS BAG RETRIEVAL 10MM (BASKET) IMPLANT
SYSTEM BAG RETRIEVAL 10MM (BASKET) IMPLANT
SYSTEM CARTER THOMASON II (TROCAR) IMPLANT
TOWEL OR 17X24 6PK STRL BLUE (TOWEL DISPOSABLE) ×4 IMPLANT
TROCAR 5MMX150MM (TROCAR) IMPLANT
TROCAR Z-THREAD FIOS 11X100 BL (TROCAR) IMPLANT
TROCAR Z-THREAD FIOS 5X100MM (TROCAR) ×4 IMPLANT
WARMER LAPAROSCOPE (MISCELLANEOUS) ×2 IMPLANT

## 2023-07-13 NOTE — Anesthesia Procedure Notes (Signed)
Procedure Name: Intubation Date/Time: 07/13/2023 10:44 AM  Performed by: Burna Cash, CRNAPre-anesthesia Checklist: Patient identified, Emergency Drugs available, Suction available and Patient being monitored Patient Re-evaluated:Patient Re-evaluated prior to induction Oxygen Delivery Method: Circle system utilized Preoxygenation: Pre-oxygenation with 100% oxygen Induction Type: IV induction Ventilation: Mask ventilation without difficulty Laryngoscope Size: Mac and 3 Grade View: Grade I Tube type: Oral Number of attempts: 1 Airway Equipment and Method: Stylet and Oral airway Placement Confirmation: ETT inserted through vocal cords under direct vision, positive ETCO2 and breath sounds checked- equal and bilateral Secured at: 21 cm Tube secured with: Tape Dental Injury: Teeth and Oropharynx as per pre-operative assessment

## 2023-07-13 NOTE — Discharge Instructions (Addendum)
Call office with any concerns [phone] ? ?Post Anesthesia Home Care Instructions ? ?Activity: ?Get plenty of rest for the remainder of the day. A responsible adult should stay with you for 24 hours following the procedure.  ?For the next 24 hours, DO NOT: ?-Drive a car ?-Paediatric nurse ?-Drink alcoholic beverages ?-Take any medication unless instructed by your physician ?-Make any legal decisions or sign important papers. ? ?Meals: ?Start with liquid foods such as gelatin or soup. Progress to regular foods as tolerated. Avoid greasy, spicy, heavy foods. If nausea and/or vomiting occur, drink only clear liquids until the nausea and/or vomiting subsides. Call your physician if vomiting continues. ? ?Special Instructions/Symptoms: ?Your throat may feel dry or sore from the anesthesia or the breathing tube placed in your throat during surgery. If this causes discomfort, gargle with warm salt water. The discomfort should disappear within 24 hours. ?. ? ?   ?

## 2023-07-13 NOTE — Anesthesia Preprocedure Evaluation (Signed)
Anesthesia Evaluation  Patient identified by MRN, date of birth, ID band Patient awake    Reviewed: Allergy & Precautions, H&P , NPO status , Patient's Chart, lab work & pertinent test results  Airway Mallampati: II  TM Distance: >3 FB Neck ROM: Full    Dental no notable dental hx.    Pulmonary sleep apnea and Continuous Positive Airway Pressure Ventilation    Pulmonary exam normal breath sounds clear to auscultation       Cardiovascular negative cardio ROS Normal cardiovascular exam Rhythm:Regular Rate:Normal     Neuro/Psych negative neurological ROS  negative psych ROS   GI/Hepatic Neg liver ROS,GERD  ,,  Endo/Other  Hypothyroidism    Renal/GU negative Renal ROS  negative genitourinary   Musculoskeletal negative musculoskeletal ROS (+)    Abdominal   Peds negative pediatric ROS (+)  Hematology negative hematology ROS (+)   Anesthesia Other Findings   Reproductive/Obstetrics negative OB ROS                             Anesthesia Physical Anesthesia Plan  ASA: 2  Anesthesia Plan: General   Post-op Pain Management: Tylenol PO (pre-op)*   Induction: Intravenous  PONV Risk Score and Plan: 3 and Ondansetron, Dexamethasone and Treatment may vary due to age or medical condition  Airway Management Planned: Oral ETT  Additional Equipment:   Intra-op Plan:   Post-operative Plan: Extubation in OR  Informed Consent: I have reviewed the patients History and Physical, chart, labs and discussed the procedure including the risks, benefits and alternatives for the proposed anesthesia with the patient or authorized representative who has indicated his/her understanding and acceptance.     Dental advisory given  Plan Discussed with: CRNA and Surgeon  Anesthesia Plan Comments:        Anesthesia Quick Evaluation

## 2023-07-13 NOTE — Transfer of Care (Signed)
Immediate Anesthesia Transfer of Care Note  Patient: Emily Houston  Procedure(s) Performed: DILATATION & CURETTAGE/HYSTEROSCOPY (Abdomen) LAPAROSCOPIC BILATERAL SALPINGECTOMY (Bilateral: Abdomen)  Patient Location: PACU  Anesthesia Type:General  Level of Consciousness: awake, alert , and oriented  Airway & Oxygen Therapy: Patient Spontanous Breathing and Patient connected to nasal cannula oxygen  Post-op Assessment: Report given to RN and Post -op Vital signs reviewed and stable  Post vital signs: Reviewed and stable  Last Vitals:  Vitals Value Taken Time  BP 107/46 07/13/23 1300  Temp    Pulse 70 07/13/23 1305  Resp 15 07/13/23 1305  SpO2 94 % 07/13/23 1305  Vitals shown include unfiled device data.  Last Pain:  Vitals:   07/13/23 1253  TempSrc:   PainSc: 0-No pain         Complications: No notable events documented.

## 2023-07-13 NOTE — Interval H&P Note (Signed)
History and Physical Interval Note:  07/13/2023 8:56 AM  Emily Houston  has presented today for surgery, with the diagnosis of abnormal bleeding and sterilization Z30.2a.  The various methods of treatment have been discussed with the patient and family. After consideration of risks, benefits and other options for treatment, the patient has consented to  Procedure(s): DILATATION & CURETTAGE/HYSTEROSCOPY WITH NOVASURE ABLATION (N/A) LAPAROSCOPIC BILATERAL SALPINGECTOMY (Bilateral) as a surgical intervention.  The patient's history has been reviewed, patient examined, no change in status, stable for surgery.  I have reviewed the patient's chart and labs.  Questions were answered to the patient's satisfaction.    After recent HCG qnt 0 in office, she has been on continuous POPs HCG prior to surgery  Reviewed post tubal ablation syndrome risk   Junius Creamer

## 2023-07-13 NOTE — Anesthesia Postprocedure Evaluation (Signed)
Anesthesia Post Note  Patient: Emily Houston  Procedure(s) Performed: DILATATION & CURETTAGE/HYSTEROSCOPY (Abdomen) LAPAROSCOPIC BILATERAL SALPINGECTOMY (Bilateral: Abdomen)     Patient location during evaluation: PACU Anesthesia Type: General Level of consciousness: awake and alert Pain management: pain level controlled Vital Signs Assessment: post-procedure vital signs reviewed and stable Respiratory status: spontaneous breathing, nonlabored ventilation, respiratory function stable and patient connected to nasal cannula oxygen Cardiovascular status: blood pressure returned to baseline and stable Postop Assessment: no apparent nausea or vomiting Anesthetic complications: no  No notable events documented.  Last Vitals:  Vitals:   07/13/23 1220 07/13/23 1253  BP: 105/66   Pulse: 92 76  Resp: 18 18  Temp:    SpO2: 100% 100%    Last Pain:  Vitals:   07/13/23 1253  TempSrc:   PainSc: 0-No pain                 Kavon Valenza S

## 2023-07-13 NOTE — Op Note (Signed)
Pre-Operative Diagnosis: desires permanent sterilization, menorrhagia, possible retained products of conception   Postoperative Diagnosis: desires permanent sterilization, menorrhagia   Procedure: Laparoscopic bilateral salpingectomy, hysteroscopy, failed Novasure endometrial ablation   Surgeon: Truett Perna, DO   Assistant: Ellison Hughs, MD    An experienced assistant was required given the standard of surgical care given the complexity of the case from her prior abdominal surgeries and maternal body habitus.    Anesthesia: GETA   Operative Findings: Large pannus, normal-appearing uterus, normal appearing fallopian tubes bilaterally, left dominant follicle, no evidence of intraabdominal adhesions, anteverted uterus sounding to 11 cm, bilateral tubal ostia seen, no evidence of retained products of conception, endometrium thin without evidence of fibroids or polyps  Specimen: bilateral fallopian tubes   EBL: 100 cc   Following the appropriate informed consent the patient was taken to the operating room. She was placed in dorsal lithotomy position, and general anesthesia was administered. The abdomen, perineum, and vagina, were prepped in the normal sterile fashion.  A foley catheter was placed in the bladder under aseptic technique. A sponge on a stick was placed to assist with uterine manipulation.  Sterile gloves were changed and attention was then turned to the abdominal portion of the case. Following the appropriate draping, 0.25% Marcaine was injected infraumbilically. An incision was made at the base of the umbilicus with a scalpel in a vertical fashion. The 5 mm Optiview trocar was used to enter the abdomen under direct visualization. Entry was confirmed and the abdomen was insufflated with  CO2 gas.  The intra-abdominal cavity was inspected and findings were as above. The patient was placed in Trendelenburg. Under direct visualization, two additional 5-mm ports were placed into the  right and left lower quadrant.  The uterus was elevated.  The abdomen and pelvis were surveyed and findings noted as above.  The LigaSure was then introduced into the abdomen. The right fallopian tube was grasped and the LigaSure was used to sequentially clamp, cut, and burn the mesosalpinx.  The tube was transected at the level of the coruna.  Likewise, the left fallopian tube was grasped and the LigaSure was used to sequentially clamp, cut, and burn the mesosalpinx.  The tubes were transected at the level of the coruna. Both tubes were removed from the abdomen.  The pelvis was surveyed and hemostasis was noted.  The abdomen was desufflated, the ports were removed. The incisions were then closed used 4-0 monocryl. Dermabond was applied to all incisions.    Attention was then turned back to the perineum. The sponge on a stick was removed. A bivalve speculum was placed in the vagina and the anterior lip of the cervix grasped with a single-tooth tenaculum. 1% lidocaine was used to perform as paracervical block.  The cervix was serially dilated with Shawnie Pons dilators.  Under direct visualization, the hysteroscope was advanced.  The uterine cavity was surveyed with the above findings. The hysteroscope was removed. The Novasure measuring device was then used to measure a total uterine cavity length of 6.0 cm. The uterine cavity length was registered on the instrument panel. The array was deployed, introduced into the uterus, and the uterine cavity width was measured at 4.7 cm and entered on the instrument panel. The cavity assessment attempted but failed. The device was removed and hysteroscopy repeated. No evidence of perforation was seen. The Novasure device was again advanced into the uterus and a single-tooth tenaculum used in a vertical fashion on the cervix prior to initiating the cervical cap.  Cavity assessment was repeated but again did not pass. At this time, the procedure was aborted as safety checks could  not be passed.   The tenaculums were removed from the cervix. A 2-0 vicryl suture was placed in a figure-of-eight fashion on one of the bleeding tenaculum sites. Hemostasis was seen. The speculum was removed from the vagina and the catheter was removed from the bladder.   At the end of the procedure, all needle, sponge and instruments counts were noted to be correct x 2. The patient was awakened from anesthesia and transferred to PACU in stable condition.    Truett Perna

## 2023-07-14 ENCOUNTER — Encounter (HOSPITAL_BASED_OUTPATIENT_CLINIC_OR_DEPARTMENT_OTHER): Payer: Self-pay | Admitting: Student

## 2023-07-14 LAB — SURGICAL PATHOLOGY

## 2024-01-01 ENCOUNTER — Encounter (HOSPITAL_BASED_OUTPATIENT_CLINIC_OR_DEPARTMENT_OTHER): Payer: Self-pay | Admitting: Emergency Medicine

## 2024-01-01 ENCOUNTER — Other Ambulatory Visit: Payer: Self-pay

## 2024-01-01 NOTE — H&P (Signed)
 Subjective Patient ID: Emily Houston is a 43 y.o. female.     HPI   Returns for follow up discussion prior to planned panniculectomy.   Highest weight 319 lb. Underwent sleeve gastric bypass 2023. Following this lowest weight current. She had pregnancy 2024 and her weight has returned to pre pregnancy. Weight stable for 6 months.    Reports recurrent yeast infection and excoriation beneath breast and abdomen. This has persisted despite weight loss as above, hygiene measures, topical antifungals and topical steroid use for over 3 month trial. She also reports back and neck pain that she has noted more with weight loss. She is unable to take NSAIDs given bariatric surgery. She has tried specialty fitted bras, Tylenol , weight loss, regular exercise for over 3 month trial without relief. Denies numbness hands.    MMG 09/2023 normal. Denies FH breast or ovarian ca   PMH significant for thyroidectomy, iron  deficiency anemia Hb 10 12/2023.    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 Lives with sister and kids ages 27 mo and 7 years.   Review of Systems  Musculoskeletal:  Positive for arthralgias, back pain, myalgias and neck pain.  Skin:  Positive for rash.  Psychiatric/Behavioral:  The patient is nervous/anxious.   All other systems reviewed and are negative.     Objective Physical Exam  Cardiovascular: Normal rate, regular rhythm and normal heart sounds.    Pulmonary/Chest Effort normal and breath sounds normal.    Skin   Fitzpatrick 6    Abd: soft no hernias,    Lymph: no palpable axillary adenopathy group 3 panniculus that extends below symphysis pubis and covers the genitalia and thighs   +shoulder grooving Breasts: no palpable masses, grade 3 ptosis bilateral SN to nipple R 29 L 30 cm BW R 19 L 19 cm Nipple to IMF R 12 L 14 cm   Assessment/Plan Panniculitis History of sleeve gastric bypass Macromastia Chronic neck and back pain Intertrigo   Chronic panniculitis  following stabilization weight loss following bariatric surgery that has failed medical management for over 3 month trial.   Reviewed panniculectomy vs abdominoplasty. Reviewed panniculectomy is not a cosmetic procedure. Reviewed changes with aging, wt gain/loss. Reviewed possible overnight stay, drains, post op limitations. Reviewed scar maturation over months. Reviewed expected area scars area soft tissue resection and expected elevation mons in mirror. Counseled will not change contour mons, will not affect back or thighs. Reviewed panniculectomy will not affect contour upper abdomen.    Quoted 100% chance wound healing problems.   Additional risks including but not limited to bleeding, hematoma, seroma, infection, wound healing problems, asymmetry need for additional procedures, damage to adjacent structures, unacceptable cosmetic result, blood clots in legs or lungs reviewed. Completed ASPS consent for panniculectomy.   Drain teaching completed. Rx for oxycodone  given. Patient reports she will be resuming iron  infusion, recommend she schedule this as soon as possible to aid with recovery.  Alger Infield, MD [facility]  Office/ physician access line after hours [phone]

## 2024-01-05 NOTE — Progress Notes (Signed)
 Ensure presurgery (drink by 0415 DOS) and CHG soap given to pt with written and verbal instructions. Pt verbalized understanding.         Enhanced Recovery after Surgery for Orthopedics Enhanced Recovery after Surgery is a protocol used to improve the stress on your body and your recovery after surgery.  Patient Instructions  The night before surgery:  No food after midnight. ONLY clear liquids after midnight  The day of surgery (if you do NOT have diabetes):  Drink ONE (1) Pre-Surgery Clear Ensure as directed.   This drink was given to you during your hospital  pre-op appointment visit. The pre-op nurse will instruct you on the time to drink the  Pre-Surgery Ensure depending on your surgery time. Finish the drink at the designated time by the pre-op nurse.  Nothing else to drink after completing the  Pre-Surgery Clear Ensure.  The day of surgery (if you have diabetes): Drink ONE (1) Gatorade 2 (G2) as directed. This drink was given to you during your hospital  pre-op appointment visit.  The pre-op nurse will instruct you on the time to drink the   Gatorade 2 (G2) depending on your surgery time. Color of the Gatorade may vary. Red is not allowed. Nothing else to drink after completing the  Gatorade 2 (G2).         If you have questions, please contact your surgeon's office.

## 2024-01-07 NOTE — Anesthesia Preprocedure Evaluation (Signed)
 Anesthesia Evaluation  Patient identified by MRN, date of birth, ID band Patient awake    Reviewed: Allergy & Precautions, H&P , NPO status , Patient's Chart, lab work & pertinent test results  Airway Mallampati: II  TM Distance: >3 FB Neck ROM: Full    Dental no notable dental hx.    Pulmonary sleep apnea and Continuous Positive Airway Pressure Ventilation    Pulmonary exam normal breath sounds clear to auscultation       Cardiovascular negative cardio ROS Normal cardiovascular exam Rhythm:Regular Rate:Normal     Neuro/Psych  PSYCHIATRIC DISORDERS Anxiety Depression    negative neurological ROS  negative psych ROS   GI/Hepatic Neg liver ROS,GERD  Medicated,,  Endo/Other  Hypothyroidism    Renal/GU negative Renal ROS  negative genitourinary   Musculoskeletal negative musculoskeletal ROS (+)    Abdominal   Peds negative pediatric ROS (+)  Hematology negative hematology ROS (+)   Anesthesia Other Findings   Reproductive/Obstetrics negative OB ROS                             Anesthesia Physical Anesthesia Plan  ASA: 2  Anesthesia Plan: General   Post-op Pain Management: Tylenol  PO (pre-op)*, Celebrex PO (pre-op)* and Ketamine IV*   Induction: Intravenous  PONV Risk Score and Plan: 3 and Ondansetron , Dexamethasone  and Treatment may vary due to age or medical condition  Airway Management Planned: Oral ETT and LMA  Additional Equipment: None  Intra-op Plan:   Post-operative Plan: Extubation in OR  Informed Consent: I have reviewed the patients History and Physical, chart, labs and discussed the procedure including the risks, benefits and alternatives for the proposed anesthesia with the patient or authorized representative who has indicated his/her understanding and acceptance.     Dental advisory given  Plan Discussed with: CRNA and Anesthesiologist  Anesthesia Plan  Comments:        Anesthesia Quick Evaluation

## 2024-01-08 ENCOUNTER — Encounter (HOSPITAL_BASED_OUTPATIENT_CLINIC_OR_DEPARTMENT_OTHER)
Admission: RE | Disposition: A | Discharge status: Home / Self Care | Payer: Self-pay | Source: Home / Self Care | Attending: Plastic Surgery

## 2024-01-08 ENCOUNTER — Ambulatory Visit (HOSPITAL_BASED_OUTPATIENT_CLINIC_OR_DEPARTMENT_OTHER): Admitting: Anesthesiology

## 2024-01-08 ENCOUNTER — Other Ambulatory Visit: Payer: Self-pay

## 2024-01-08 ENCOUNTER — Encounter (HOSPITAL_BASED_OUTPATIENT_CLINIC_OR_DEPARTMENT_OTHER): Payer: Self-pay | Admitting: Plastic Surgery

## 2024-01-08 ENCOUNTER — Ambulatory Visit (HOSPITAL_BASED_OUTPATIENT_CLINIC_OR_DEPARTMENT_OTHER)
Admission: RE | Admit: 2024-01-08 | Discharge: 2024-01-09 | Disposition: A | Discharge status: Home / Self Care | Attending: Plastic Surgery | Admitting: Plastic Surgery

## 2024-01-08 DIAGNOSIS — G8929 Other chronic pain: Secondary | ICD-10-CM | POA: Diagnosis not present

## 2024-01-08 DIAGNOSIS — M542 Cervicalgia: Secondary | ICD-10-CM | POA: Diagnosis not present

## 2024-01-08 DIAGNOSIS — G4730 Sleep apnea, unspecified: Secondary | ICD-10-CM | POA: Diagnosis not present

## 2024-01-08 DIAGNOSIS — D509 Iron deficiency anemia, unspecified: Secondary | ICD-10-CM | POA: Diagnosis not present

## 2024-01-08 DIAGNOSIS — Z9884 Bariatric surgery status: Secondary | ICD-10-CM | POA: Insufficient documentation

## 2024-01-08 DIAGNOSIS — M549 Dorsalgia, unspecified: Secondary | ICD-10-CM | POA: Insufficient documentation

## 2024-01-08 DIAGNOSIS — N62 Hypertrophy of breast: Secondary | ICD-10-CM | POA: Diagnosis not present

## 2024-01-08 DIAGNOSIS — E890 Postprocedural hypothyroidism: Secondary | ICD-10-CM | POA: Insufficient documentation

## 2024-01-08 DIAGNOSIS — M793 Panniculitis, unspecified: Secondary | ICD-10-CM

## 2024-01-08 DIAGNOSIS — L304 Erythema intertrigo: Secondary | ICD-10-CM | POA: Diagnosis not present

## 2024-01-08 DIAGNOSIS — Z01818 Encounter for other preprocedural examination: Secondary | ICD-10-CM

## 2024-01-08 HISTORY — PX: PANNICULECTOMY: SHX5360

## 2024-01-08 LAB — POCT PREGNANCY, URINE: Preg Test, Ur: NEGATIVE

## 2024-01-08 SURGERY — PANNICULECTOMY
Anesthesia: General | Site: Abdomen

## 2024-01-08 MED ORDER — METHOCARBAMOL 500 MG PO TABS: 500.0000 mg | ORAL_TABLET | Freq: Four times a day (QID) | ORAL | Status: DC | PRN

## 2024-01-08 MED ORDER — ONDANSETRON HCL 4 MG/2ML IJ SOLN: 4.0000 mg | Freq: Once | INTRAMUSCULAR | Status: DC | PRN

## 2024-01-08 MED ORDER — ACETAMINOPHEN 500 MG PO TABS
ORAL_TABLET | ORAL | Status: AC
  Filled 2024-01-08: qty 2

## 2024-01-08 MED ORDER — HEPARIN SODIUM (PORCINE) 5000 UNIT/ML IJ SOLN
5000.0000 [IU] | Freq: Once | INTRAMUSCULAR | Status: AC
Start: 2024-01-08 — End: 2024-01-08
  Administered 2024-01-08: 5000 [IU] via SUBCUTANEOUS

## 2024-01-08 MED ORDER — ROCURONIUM BROMIDE 10 MG/ML (PF) SYRINGE
PREFILLED_SYRINGE | INTRAVENOUS | Status: AC
  Filled 2024-01-08: qty 10

## 2024-01-08 MED ORDER — ONDANSETRON HCL 4 MG/2ML IJ SOLN
INTRAMUSCULAR | Status: AC
  Filled 2024-01-08: qty 2

## 2024-01-08 MED ORDER — OXYCODONE HCL 5 MG PO TABS: 5.0000 mg | ORAL_TABLET | Freq: Once | ORAL | Status: DC | PRN

## 2024-01-08 MED ORDER — BUPIVACAINE HCL (PF) 0.25 % IJ SOLN
INTRAMUSCULAR | Status: AC
  Filled 2024-01-08: qty 30

## 2024-01-08 MED ORDER — HYDROMORPHONE HCL 1 MG/ML IJ SOLN
INTRAMUSCULAR | Status: DC | PRN
  Administered 2024-01-08 (×2): .25 mg via INTRAVENOUS

## 2024-01-08 MED ORDER — LEVOTHYROXINE SODIUM 100 MCG PO TABS
200.0000 ug | ORAL_TABLET | Freq: Every day | ORAL | Status: DC
  Filled 2024-01-08 (×2): qty 2

## 2024-01-08 MED ORDER — EPHEDRINE 5 MG/ML INJ
INTRAVENOUS | Status: AC
  Filled 2024-01-08: qty 5

## 2024-01-08 MED ORDER — PHENYLEPHRINE HCL (PRESSORS) 10 MG/ML IV SOLN
INTRAVENOUS | Status: DC | PRN
  Administered 2024-01-08: 160 ug via INTRAVENOUS
  Administered 2024-01-08 (×4): 80 ug via INTRAVENOUS

## 2024-01-08 MED ORDER — HYDROMORPHONE HCL 1 MG/ML IJ SOLN: 0.5000 mg | INTRAMUSCULAR | Status: DC | PRN

## 2024-01-08 MED ORDER — ACETAMINOPHEN 325 MG RE SUPP: 650.0000 mg | Freq: Four times a day (QID) | RECTAL | Status: DC | PRN

## 2024-01-08 MED ORDER — CEFAZOLIN SODIUM-DEXTROSE 2-4 GM/100ML-% IV SOLN
INTRAVENOUS | Status: AC
  Filled 2024-01-08: qty 100

## 2024-01-08 MED ORDER — FENTANYL CITRATE (PF) 100 MCG/2ML IJ SOLN
INTRAMUSCULAR | Status: AC
  Filled 2024-01-08: qty 2

## 2024-01-08 MED ORDER — OXYCODONE HCL 5 MG PO TABS
5.0000 mg | ORAL_TABLET | ORAL | Status: DC | PRN
  Administered 2024-01-08: 5 mg via ORAL
  Filled 2024-01-08: qty 1

## 2024-01-08 MED ORDER — PROPOFOL 1000 MG/100ML IV EMUL
INTRAVENOUS | Status: AC
  Filled 2024-01-08: qty 100

## 2024-01-08 MED ORDER — FENTANYL CITRATE (PF) 100 MCG/2ML IJ SOLN: 25.0000 ug | INTRAMUSCULAR | Status: DC | PRN

## 2024-01-08 MED ORDER — ENOXAPARIN SODIUM 40 MG/0.4ML IJ SOSY
40.0000 mg | PREFILLED_SYRINGE | INTRAMUSCULAR | Status: DC
  Administered 2024-01-09: 40 mg via SUBCUTANEOUS
  Filled 2024-01-08: qty 0.4

## 2024-01-08 MED ORDER — DEXAMETHASONE SODIUM PHOSPHATE 10 MG/ML IJ SOLN
INTRAMUSCULAR | Status: AC
  Filled 2024-01-08: qty 1

## 2024-01-08 MED ORDER — CELECOXIB 200 MG PO CAPS
ORAL_CAPSULE | ORAL | Status: AC
  Filled 2024-01-08: qty 1

## 2024-01-08 MED ORDER — ROCURONIUM BROMIDE 100 MG/10ML IV SOLN
INTRAVENOUS | Status: DC | PRN
Start: 2024-01-08 — End: 2024-01-08
  Administered 2024-01-08 (×2): 20 mg via INTRAVENOUS
  Administered 2024-01-08: 50 mg via INTRAVENOUS
  Administered 2024-01-08: 30 mg via INTRAVENOUS

## 2024-01-08 MED ORDER — CHLORHEXIDINE GLUCONATE CLOTH 2 % EX PADS: 6.0000 | MEDICATED_PAD | Freq: Once | CUTANEOUS | Status: DC

## 2024-01-08 MED ORDER — BUPIVACAINE HCL (PF) 0.5 % IJ SOLN
INTRAMUSCULAR | Status: AC
  Filled 2024-01-08: qty 30

## 2024-01-08 MED ORDER — LIDOCAINE 2% (20 MG/ML) 5 ML SYRINGE
INTRAMUSCULAR | Status: AC
  Filled 2024-01-08: qty 5

## 2024-01-08 MED ORDER — LACTATED RINGERS IV SOLN: INTRAVENOUS | Status: DC

## 2024-01-08 MED ORDER — 0.9 % SODIUM CHLORIDE (POUR BTL) OPTIME
TOPICAL | Status: DC | PRN
  Administered 2024-01-08: 1000 mL

## 2024-01-08 MED ORDER — GABAPENTIN 300 MG PO CAPS
ORAL_CAPSULE | ORAL | Status: AC
  Filled 2024-01-08: qty 1

## 2024-01-08 MED ORDER — PROPOFOL 10 MG/ML IV BOLUS
INTRAVENOUS | Status: AC
  Filled 2024-01-08: qty 20

## 2024-01-08 MED ORDER — MEPERIDINE HCL 25 MG/ML IJ SOLN: 6.2500 mg | INTRAMUSCULAR | Status: DC | PRN

## 2024-01-08 MED ORDER — ONDANSETRON HCL 4 MG/2ML IJ SOLN: 4.0000 mg | Freq: Four times a day (QID) | INTRAMUSCULAR | Status: DC | PRN

## 2024-01-08 MED ORDER — GABAPENTIN 300 MG PO CAPS
300.0000 mg | ORAL_CAPSULE | ORAL | Status: AC
  Administered 2024-01-08: 300 mg via ORAL

## 2024-01-08 MED ORDER — HYDROMORPHONE HCL 1 MG/ML IJ SOLN
INTRAMUSCULAR | Status: AC
  Filled 2024-01-08: qty 0.5

## 2024-01-08 MED ORDER — PHENYLEPHRINE 80 MCG/ML (10ML) SYRINGE FOR IV PUSH (FOR BLOOD PRESSURE SUPPORT)
PREFILLED_SYRINGE | INTRAVENOUS | Status: AC
  Filled 2024-01-08: qty 10

## 2024-01-08 MED ORDER — PROPOFOL 10 MG/ML IV BOLUS
INTRAVENOUS | Status: DC | PRN
  Administered 2024-01-08: 100 mg via INTRAVENOUS

## 2024-01-08 MED ORDER — EPHEDRINE SULFATE (PRESSORS) 50 MG/ML IJ SOLN
INTRAMUSCULAR | Status: DC | PRN
  Administered 2024-01-08: 5 mg via INTRAVENOUS

## 2024-01-08 MED ORDER — ACETAMINOPHEN 325 MG PO TABS
650.0000 mg | ORAL_TABLET | Freq: Four times a day (QID) | ORAL | Status: DC | PRN
Start: 2024-01-08 — End: 2024-01-09

## 2024-01-08 MED ORDER — ONDANSETRON 4 MG PO TBDP
4.0000 mg | ORAL_TABLET | Freq: Four times a day (QID) | ORAL | Status: DC | PRN
Start: 2024-01-08 — End: 2024-01-09

## 2024-01-08 MED ORDER — HEPARIN SODIUM (PORCINE) 5000 UNIT/ML IJ SOLN
INTRAMUSCULAR | Status: AC
  Filled 2024-01-08: qty 1

## 2024-01-08 MED ORDER — OXYCODONE HCL 5 MG/5ML PO SOLN: 5.0000 mg | Freq: Once | ORAL | Status: DC | PRN

## 2024-01-08 MED ORDER — KCL IN DEXTROSE-NACL 20-5-0.45 MEQ/L-%-% IV SOLN
INTRAVENOUS | Status: DC
  Filled 2024-01-08: qty 1000

## 2024-01-08 MED ORDER — SERTRALINE HCL 100 MG PO TABS: 100.0000 mg | ORAL_TABLET | Freq: Every day | ORAL | Status: DC

## 2024-01-08 MED ORDER — MIDAZOLAM HCL 5 MG/5ML IJ SOLN
INTRAMUSCULAR | Status: DC | PRN
Start: 2024-01-08 — End: 2024-01-08
  Administered 2024-01-08 (×2): 1 mg via INTRAVENOUS

## 2024-01-08 MED ORDER — MIDAZOLAM HCL 2 MG/2ML IJ SOLN
INTRAMUSCULAR | Status: AC
Start: 2024-01-08 — End: 2024-01-08
  Filled 2024-01-08: qty 2

## 2024-01-08 MED ORDER — ONDANSETRON HCL 4 MG/2ML IJ SOLN
INTRAMUSCULAR | Status: DC | PRN
  Administered 2024-01-08: 4 mg via INTRAVENOUS

## 2024-01-08 MED ORDER — SUGAMMADEX SODIUM 200 MG/2ML IV SOLN
INTRAVENOUS | Status: DC | PRN
Start: 2024-01-08 — End: 2024-01-08
  Administered 2024-01-08: 200 mg via INTRAVENOUS

## 2024-01-08 MED ORDER — LIDOCAINE HCL (CARDIAC) PF 100 MG/5ML IV SOSY
PREFILLED_SYRINGE | INTRAVENOUS | Status: DC | PRN
  Administered 2024-01-08: 100 mg via INTRAVENOUS

## 2024-01-08 MED ORDER — SERTRALINE HCL 100 MG PO TABS
100.0000 mg | ORAL_TABLET | Freq: Every day | ORAL | Status: DC
  Filled 2024-01-08: qty 1

## 2024-01-08 MED ORDER — CEFAZOLIN SODIUM-DEXTROSE 2-4 GM/100ML-% IV SOLN
2.0000 g | INTRAVENOUS | Status: AC
  Administered 2024-01-08: 2 g via INTRAVENOUS

## 2024-01-08 MED ORDER — DEXAMETHASONE SODIUM PHOSPHATE 10 MG/ML IJ SOLN
INTRAMUSCULAR | Status: DC | PRN
  Administered 2024-01-08: 8 mg via INTRAVENOUS

## 2024-01-08 MED ORDER — ACETAMINOPHEN 500 MG PO TABS
1000.0000 mg | ORAL_TABLET | ORAL | Status: DC
Start: 2024-01-08 — End: 2024-01-08

## 2024-01-08 MED ORDER — CELECOXIB 200 MG PO CAPS: 200.0000 mg | ORAL_CAPSULE | Freq: Once | ORAL | Status: DC

## 2024-01-08 MED ORDER — FENTANYL CITRATE (PF) 100 MCG/2ML IJ SOLN
INTRAMUSCULAR | Status: DC | PRN
  Administered 2024-01-08: 25 ug via INTRAVENOUS
  Administered 2024-01-08: 50 ug via INTRAVENOUS
  Administered 2024-01-08 (×5): 25 ug via INTRAVENOUS

## 2024-01-08 MED ORDER — ALBUMIN HUMAN 5 % IV SOLN: INTRAVENOUS | Status: DC | PRN

## 2024-01-08 MED ORDER — BUPIVACAINE HCL (PF) 0.5 % IJ SOLN
INTRAMUSCULAR | Status: DC | PRN
  Administered 2024-01-08: 30 mL

## 2024-01-08 MED ORDER — ACETAMINOPHEN 500 MG PO TABS
1000.0000 mg | ORAL_TABLET | Freq: Once | ORAL | Status: AC
  Administered 2024-01-08: 1000 mg via ORAL

## 2024-01-08 MED ORDER — PROPOFOL 500 MG/50ML IV EMUL
INTRAVENOUS | Status: DC | PRN
  Administered 2024-01-08: 20 ug/kg/min via INTRAVENOUS

## 2024-01-08 SURGICAL SUPPLY — 52 items
BINDER ABDOMINAL 10 UNV 27-48 (MISCELLANEOUS) IMPLANT
BINDER ABDOMINAL 12 SM 30-45 (SOFTGOODS) IMPLANT
BLADE CLIPPER SURG (BLADE) IMPLANT
BLADE SURG 10 STRL SS (BLADE) ×2 IMPLANT
BLADE SURG 11 STRL SS (BLADE) ×1 IMPLANT
BLADE SURG 15 STRL LF DISP TIS (BLADE) IMPLANT
CANISTER SUCT 1200ML W/VALVE (MISCELLANEOUS) ×1 IMPLANT
CHLORAPREP W/TINT 26 (MISCELLANEOUS) ×1 IMPLANT
CLIP APPLIE 9.375 MED OPEN (MISCELLANEOUS) ×1 IMPLANT
COVER BACK TABLE 60X90IN (DRAPES) ×1 IMPLANT
COVER MAYO STAND STRL (DRAPES) ×1 IMPLANT
DERMABOND ADVANCED .7 DNX12 (GAUZE/BANDAGES/DRESSINGS) ×2 IMPLANT
DRAIN CHANNEL 15F RND FF W/TCR (WOUND CARE) ×2 IMPLANT
DRAIN CHANNEL 19F RND (DRAIN) IMPLANT
DRAPE TOP ARMCOVERS (MISCELLANEOUS) ×1 IMPLANT
DRAPE U-SHAPE 76X120 STRL (DRAPES) ×1 IMPLANT
DRAPE UTILITY XL STRL (DRAPES) ×1 IMPLANT
ELECT COATED BLADE 2.86 ST (ELECTRODE) IMPLANT
ELECTRODE BLDE 4.0 EZ CLN MEGD (MISCELLANEOUS) IMPLANT
ELECTRODE REM PT RTRN 9FT ADLT (ELECTROSURGICAL) ×1 IMPLANT
EVACUATOR SILICONE 100CC (DRAIN) ×2 IMPLANT
GAUZE PAD ABD 8X10 STRL (GAUZE/BANDAGES/DRESSINGS) ×2 IMPLANT
GAUZE XEROFORM 1X8 LF (GAUZE/BANDAGES/DRESSINGS) IMPLANT
GLOVE BIO SURGEON STRL SZ 6 (GLOVE) ×3 IMPLANT
GOWN STRL REUS W/ TWL LRG LVL3 (GOWN DISPOSABLE) ×2 IMPLANT
NDL HYPO 25X1 1.5 SAFETY (NEEDLE) IMPLANT
NEEDLE HYPO 25X1 1.5 SAFETY (NEEDLE) ×1 IMPLANT
NS IRRIG 1000ML POUR BTL (IV SOLUTION) ×1 IMPLANT
PACK BASIN DAY SURGERY FS (CUSTOM PROCEDURE TRAY) ×1 IMPLANT
PENCIL SMOKE EVACUATOR (MISCELLANEOUS) ×1 IMPLANT
PIN SAFETY STERILE (MISCELLANEOUS) ×1 IMPLANT
SHEET MEDIUM DRAPE 40X70 STRL (DRAPES) ×2 IMPLANT
SLEEVE SCD COMPRESS KNEE MED (STOCKING) ×1 IMPLANT
SPONGE T-LAP 18X18 ~~LOC~~+RFID (SPONGE) ×2 IMPLANT
STAPLER SKIN PROX WIDE 3.9 (STAPLE) ×1 IMPLANT
SUT ETHILON 2 0 FS 18 (SUTURE) ×2 IMPLANT
SUT MNCRL AB 4-0 PS2 18 (SUTURE) ×2 IMPLANT
SUT PDS AB 0 CT 36 (SUTURE) ×1 IMPLANT
SUT PDS AB 2-0 CT2 27 (SUTURE) IMPLANT
SUT STRATA 3-0 60 PS-1 (SUTURE) IMPLANT
SUT VICRYL RAPID 5 0 P 3 (SUTURE) IMPLANT
SUT VICRYL RAPIDE 4-0 (SUTURE) IMPLANT
SUTURE STRATFX 0 PDS 27 VIOLET (SUTURE) ×1 IMPLANT
SUTURE STRATFX MNCRL+ 3-0 PS-2 (SUTURE) IMPLANT
SYR 10ML LL (SYRINGE) IMPLANT
SYR BULB IRRIG 60ML STRL (SYRINGE) ×1 IMPLANT
SYR CONTROL 10ML LL (SYRINGE) IMPLANT
TOWEL GREEN STERILE FF (TOWEL DISPOSABLE) ×1 IMPLANT
TRAY FOLEY W/BAG SLVR 14FR LF (SET/KITS/TRAYS/PACK) IMPLANT
TUBE CONNECTING 20X1/4 (TUBING) ×1 IMPLANT
UNDERPAD 30X36 HEAVY ABSORB (UNDERPADS AND DIAPERS) ×2 IMPLANT
YANKAUER SUCT BULB TIP NO VENT (SUCTIONS) ×1 IMPLANT

## 2024-01-08 NOTE — Transfer of Care (Signed)
 Immediate Anesthesia Transfer of Care Note  Patient: Emily Houston  Procedure(s) Performed: PANNICULECTOMY (Abdomen)  Patient Location: PACU  Anesthesia Type:General  Level of Consciousness: oriented, drowsy, and patient cooperative  Airway & Oxygen Therapy: Patient Spontanous Breathing and Patient connected to face mask oxygen  Post-op Assessment: Report given to RN and Post -op Vital signs reviewed and stable  Post vital signs: Reviewed and stable  Last Vitals:  Vitals Value Taken Time  BP 109/77 01/08/24 10:50  Temp    Pulse 88 01/08/24 10:53  Resp    SpO2 100 % 01/08/24 10:53  Vitals shown include unfiled device data.  Last Pain:  Vitals:   01/08/24 0656  TempSrc: Oral  PainSc: 0-No pain      Patients Stated Pain Goal: 4 (01/08/24 0656)  Complications: No notable events documented.

## 2024-01-08 NOTE — Anesthesia Procedure Notes (Signed)
 Procedure Name: Intubation Date/Time: 01/08/2024 7:44 AM  Performed by: Kathern Rollene LABOR, CRNAPre-anesthesia Checklist: Patient identified, Emergency Drugs available, Suction available and Patient being monitored Patient Re-evaluated:Patient Re-evaluated prior to induction Oxygen Delivery Method: Circle system utilized Preoxygenation: Pre-oxygenation with 100% oxygen Induction Type: IV induction Ventilation: Mask ventilation without difficulty Laryngoscope Size: Mac and 3 Grade View: Grade I Tube type: Oral Tube size: 7.0 mm Number of attempts: 1 Placement Confirmation: ETT inserted through vocal cords under direct vision, positive ETCO2 and breath sounds checked- equal and bilateral Secured at: 22 cm Tube secured with: Tape Dental Injury: Teeth and Oropharynx as per pre-operative assessment

## 2024-01-08 NOTE — OR Nursing (Signed)
 Pannus weighed and discarded per MD instruction.

## 2024-01-08 NOTE — Op Note (Signed)
 Operative Note   DATE OF OPERATION: 6.23.2025  LOCATION: [facility]-observation  SURGICAL DIVISION: Plastic Surgery  PREOPERATIVE DIAGNOSES:  1. Panniculitis 2. History bariatric surgery  POSTOPERATIVE DIAGNOSES:  same  PROCEDURE:  Panniculectomy  SURGEON: Earlis Ranks MD MBA  ASSISTANT: none  ANESTHESIA:  General.   EBL: 100 ml  COMPLICATIONS: None immediate.   INDICATIONS FOR PROCEDURE:  The patient, Emily Houston, is a 43 y.o. female born on Jan 25, 1981, is here for treatment recurrent panniculitis that has failed conservative measures.   FINDINGS: Abdominal soft tissue resection 2533 g  DESCRIPTION OF PROCEDURE:  The patient's caudal incision was marked 7 cm cephalad to anterior fourchette caudal to prior scar. Incision was extended over lateral abdomen. Subcutaneous heparin  administered. The patient was taken to the operating room. SCDs were placed and IV antibiotics were given. The patient's operative site was prepped and draped in a sterile fashion. A time out was performed and all information was confirmed to be correct.     Low transverse abdominal incision carried through superficial fascia to abdominal wall. Skin flap elevated in sub Scarpa's layer, taking care to leave layer of subfascial fat over abdominal wall fascia. Dissection completed toward umbilicus. Umbilicus sharply incised and scissor dissection completed to free from abdominal skin flap. Additional dissection completed in midline toward xiphoid. Wound irrigated and hemostasis obtained. Local anesthetic infiltrated. Caudal extent skin excision marked by palpation. Area marked excised. 19 Fr JP placed in subcutaneous right and left abdomen and secured with 2-0 nylon. Superiorly based U shaped skin flap incised for delivery umbilicus. Low transverse abdominal skin incision closed with 0 PDS interrupted in superficial fascia. 0 Strattafix used to close dermis and 3-0 monocryl Strattafix used for  subcuticular skin closure. Umbilicus inset with 4-0 monocryl in dermis and 5-0 vicryl rapide for skin closure. Xeroform bolster placed within umbilicus. Tissue adhesive applied to low transverse incision.   Dry dressing and abdominal binder applied. The patient was allowed to wake from anesthesia, extubated and taken to the recovery room in satisfactory condition.  SPECIMENS: none  DRAINS: 19 Fr JP in right and left subcutaneous abdomen  Earlis Ranks, MD [facility] Plastic & Reconstructive Surgery  Office/ physician access line after hours [phone]

## 2024-01-08 NOTE — Anesthesia Postprocedure Evaluation (Signed)
 Anesthesia Post Note  Patient: Emily Houston  Procedure(s) Performed: PANNICULECTOMY (Abdomen)     Patient location during evaluation: PACU Anesthesia Type: General Level of consciousness: sedated and patient cooperative Pain management: pain level controlled Vital Signs Assessment: post-procedure vital signs reviewed and stable Respiratory status: spontaneous breathing Cardiovascular status: stable Anesthetic complications: no   No notable events documented.  Last Vitals:  Vitals:   01/08/24 1130 01/08/24 1311  BP: 98/70   Pulse: 78 78  Resp: 13   Temp:    SpO2: 95% 97%    Last Pain:  Vitals:   01/08/24 1256  TempSrc:   PainSc: Asleep                 Norleen Pope

## 2024-01-08 NOTE — Interval H&P Note (Signed)
 History and Physical Interval Note:  01/08/2024 6:59 AM  Emily Houston  has presented today for surgery, with the diagnosis of M79.3, Z98.84,.  The various methods of treatment have been discussed with the patient and family. After consideration of risks, benefits and other options for treatment, the patient has consented to  Procedure(s): PANNICULECTOMY (N/A) as a surgical intervention.  The patient's history has been reviewed, patient examined, no change in status, stable for surgery.  I have reviewed the patient's chart and labs.  Questions were answered to the patient's satisfaction.     Earlis Duvan Mousel

## 2024-01-09 ENCOUNTER — Encounter (HOSPITAL_BASED_OUTPATIENT_CLINIC_OR_DEPARTMENT_OTHER): Payer: Self-pay | Admitting: Plastic Surgery

## 2024-01-09 DIAGNOSIS — M793 Panniculitis, unspecified: Secondary | ICD-10-CM | POA: Diagnosis not present

## 2024-01-09 NOTE — Discharge Instructions (Signed)
 About my Jackson-Pratt Bulb Drain  What is a Jackson-Pratt bulb? A Jackson-Pratt is a soft, round device used to collect drainage. It is connected to a long, thin drainage catheter, which is held in place by one or two small stiches near your surgical incision site. When the bulb is squeezed, it forms a vacuum, forcing the drainage to empty into the bulb.  Emptying the Jackson-Pratt bulb- To empty the bulb: 1. Release the plug on the top of the bulb. 2. Pour the bulb's contents into a measuring container which your nurse will provide. 3. Record the time emptied and amount of drainage. Empty the drain(s) as often as your     doctor or nurse recommends.  Date                  Time                    Amount (Drain 1)                 Amount (Drain 2)  _____________________________________________________________________  _____________________________________________________________________  _____________________________________________________________________  _____________________________________________________________________  _____________________________________________________________________  _____________________________________________________________________  _____________________________________________________________________  _____________________________________________________________________  Squeezing the Jackson-Pratt Bulb- To squeeze the bulb: 1. Make sure the plug at the top of the bulb is open. 2. Squeeze the bulb tightly in your fist. You will hear air squeezing from the bulb. 3. Replace the plug while the bulb is squeezed. 4. Use a safety pin to attach the bulb to your clothing. This will keep the catheter from     pulling at the bulb insertion site.  When to call your doctor- Call your doctor if: Drain site becomes red, swollen or hot. You have a fever greater than 101 degrees F. There is oozing at the drain site. Drain falls out (apply a guaze bandage  over the drain hole and secure it with tape). Drainage increases daily not related to activity patterns. (You will usually have more drainage when you are active than when you are resting.) Drainage has a bad odor.     JP Drain [employer] this sheet to all of your post-operative appointments while you have your drains. Please measure your drains by CC's or ML's. Make sure you drain and measure your JP Drains 3 times per day. At the end of each day, add up totals for the left side and add up totals for the right side.    ( 9 am )     ( 3 pm )        ( 9 pm )                Date L  R  L  R  L  R  Total L/R

## 2024-01-09 NOTE — Discharge Summary (Signed)
 Physician Discharge Summary  Patient ID: ABEEHA TWIST MRN: [id number] DOB/AGE: [birth year] 43 y.o.  Admit date: 01/08/2024 Discharge date: 01/09/2024  Admission Diagnoses: Panniculitis, history bariatric surgery  Discharge Diagnoses:  same  Discharged Condition: stable  Hospital Course: Post operatively patient did well with pain controlled, ambulatory with minimal assist. Instructed on bathing activity and drain care.   Treatments: surgery: panniculectomy 6.23.25  Discharge Exam: Blood pressure (!) 91/59, pulse 72, temperature 98.2 F (36.8 C), resp. rate 16, height 5' 5 (1.651 m), weight 71.1 kg, last menstrual period 12/01/2023, SpO2 98%, not currently breastfeeding. Incision/Wound: abdomen soft incisions intact umbilicus viable drains serosanguinous  Disposition: Discharge disposition: 01-Home or Self Care       Discharge Instructions     Call MD for:  redness, tenderness, or signs of infection (pain, swelling, bleeding, redness, odor or green/yellow discharge around incision site)   Complete by: As directed    Call MD for:  temperature >100.5   Complete by: As directed    Discharge instructions   Complete by: As directed    Ok to remove dressings and shower am 6.25.25. Soap and water ok, pat incisions dry. No creams or ointments over incisions. Do not let drains dangle in shower, attach to lanyard or similar.Strip and record drains twice daily and bring log to clinic visit.  Abdominal binder or compression garment all other times.  Ok to raise arms above shoulders for bathing and dressing.  No house yard work or exercise until cleared by MD.   Patient received all Rx preop. Recommend Miralax or Dulcolax as needed for constipation.   Ambulate bent at hip. Do not lie flat. Sleep with head elevated on 2-3 pillows and pillow beneath knees.   Driving Restrictions   Complete by: As directed    No driving if taking prescription pain medication   Lifting restrictions    Complete by: As directed    No lifting > 5-10 lbs until cleared by MD   Resume previous diet   Complete by: As directed       Allergies as of 01/09/2024       Reactions   Aspirin Other (See Comments)   Dizziness.         Medication List     TAKE these medications    ACCRUFeR 30 MG Caps Generic drug: Ferric Maltol Take 30 mg by mouth 2 (two) times daily.   acetaminophen  500 MG tablet Commonly known as: TYLENOL  Take 2 tablets (1,000 mg total) by mouth in the morning, at noon, and at bedtime.   calcitRIOL 0.5 MCG capsule Commonly known as: ROCALTROL Take 1 mcg by mouth daily.   cholecalciferol 25 MCG (1000 UNIT) tablet Commonly known as: VITAMIN D3 Take 1,000 Units by mouth daily.   Levothyroxine  Sodium 125 MCG Caps Take 200 mcg by mouth daily before breakfast.   multivitamin tablet Take 1 tablet by mouth daily.   oxyCODONE  5 MG immediate release tablet Commonly known as: Roxicodone  Take 1 tablet (5 mg total) by mouth every 4 (four) hours as needed for severe pain (pain score 7-10).   sertraline 100 MG tablet Commonly known as: ZOLOFT Take 100 mg by mouth daily.   simethicone  80 MG chewable tablet Commonly known as: MYLICON Chew 1 tablet (80 mg total) by mouth every 6 (six) hours as needed for flatulence.   YORVIPATH  [place] Inject into the skin.        Follow-up Information     Arelia Filippo, MD Follow up  in 1 week(s).   Specialty: Plastic Surgery Why: as scheduled Contact information: [address] [place] [address] [phone]                 Signed: Earlis Ranks 01/09/2024, 7:11 AM
